# Patient Record
Sex: Female | Born: 1955 | Race: White | Hispanic: No | Marital: Married | Smoking: Never smoker
Health system: Southern US, Community
[De-identification: ages and names within clinical notes are randomized; demographics above are authoritative.]

## PROBLEM LIST (undated history)

## (undated) DIAGNOSIS — S82043A Displaced comminuted fracture of unspecified patella, initial encounter for closed fracture: Secondary | ICD-10-CM

## (undated) DIAGNOSIS — K219 Gastro-esophageal reflux disease without esophagitis: Secondary | ICD-10-CM

## (undated) DIAGNOSIS — Z1211 Encounter for screening for malignant neoplasm of colon: Secondary | ICD-10-CM

## (undated) HISTORY — PX: TUBAL LIGATION: SHX77

## (undated) HISTORY — PX: ABDOMINAL HYSTERECTOMY: SHX81

## (undated) HISTORY — DX: Displaced comminuted fracture of unspecified patella, initial encounter for closed fracture: S82.043A

## (undated) HISTORY — DX: Gastro-esophageal reflux disease without esophagitis: K21.9

## (undated) HISTORY — PX: FRACTURE SURGERY: SHX138

## (undated) HISTORY — PX: BLADDER SURGERY: SHX569

## (undated) HISTORY — DX: Encounter for screening for malignant neoplasm of colon: Z12.11

## (undated) HISTORY — PX: COLONOSCOPY WITH ESOPHAGOGASTRODUODENOSCOPY (EGD): SHX5779

---

## 2006-03-18 ENCOUNTER — Ambulatory Visit: Payer: Self-pay | Admitting: Family Medicine

## 2006-06-10 ENCOUNTER — Ambulatory Visit: Payer: Self-pay | Admitting: Internal Medicine

## 2007-09-14 ENCOUNTER — Ambulatory Visit: Payer: Self-pay | Admitting: Gastroenterology

## 2007-10-23 ENCOUNTER — Ambulatory Visit: Payer: Self-pay | Admitting: Gastroenterology

## 2007-10-27 ENCOUNTER — Ambulatory Visit: Payer: Self-pay | Admitting: Gastroenterology

## 2007-11-19 ENCOUNTER — Ambulatory Visit: Payer: Self-pay | Admitting: Gastroenterology

## 2007-11-20 ENCOUNTER — Ambulatory Visit: Payer: Self-pay | Admitting: Surgery

## 2007-11-27 ENCOUNTER — Ambulatory Visit: Payer: Self-pay | Admitting: Surgery

## 2009-02-16 ENCOUNTER — Ambulatory Visit (HOSPITAL_COMMUNITY): Admission: RE | Admit: 2009-02-16 | Discharge: 2009-02-16 | Payer: Self-pay | Admitting: Family Medicine

## 2011-12-23 ENCOUNTER — Ambulatory Visit: Payer: Self-pay | Admitting: Family Medicine

## 2012-01-01 ENCOUNTER — Ambulatory Visit: Payer: Self-pay | Admitting: Family Medicine

## 2013-03-02 ENCOUNTER — Ambulatory Visit: Payer: Self-pay | Admitting: Family Medicine

## 2013-04-15 HISTORY — PX: KNEE SURGERY: SHX244

## 2013-09-13 DIAGNOSIS — Z1211 Encounter for screening for malignant neoplasm of colon: Secondary | ICD-10-CM

## 2013-09-13 HISTORY — DX: Encounter for screening for malignant neoplasm of colon: Z12.11

## 2013-10-01 ENCOUNTER — Emergency Department: Payer: Self-pay | Admitting: Emergency Medicine

## 2013-10-04 DIAGNOSIS — S82043A Displaced comminuted fracture of unspecified patella, initial encounter for closed fracture: Secondary | ICD-10-CM

## 2013-10-04 HISTORY — DX: Displaced comminuted fracture of unspecified patella, initial encounter for closed fracture: S82.043A

## 2013-10-05 ENCOUNTER — Ambulatory Visit: Payer: Self-pay | Admitting: General Practice

## 2013-10-06 ENCOUNTER — Ambulatory Visit: Payer: Self-pay | Admitting: General Practice

## 2014-03-29 ENCOUNTER — Ambulatory Visit: Payer: Self-pay | Admitting: Family Medicine

## 2014-07-06 ENCOUNTER — Ambulatory Visit: Payer: Self-pay | Admitting: Family Medicine

## 2014-08-06 NOTE — Op Note (Signed)
PATIENT NAME:  Mary Graham, Mary Graham MR#:  [id number] DATE OF BIRTH:  [birth year]  DATE OF PROCEDURE:  10/06/2013  PREOPERATIVE DIAGNOSIS: Comminuted right patellar fracture.   POSTOPERATIVE DIAGNOSIS: Comminuted right patellar fracture.   PROCEDURE PERFORMED: Open reduction internal fixation of right patellar fracture using a tension band wire technique.   SURGEON: Skip Estimable, MD.   ANESTHESIA: General.   ESTIMATED BLOOD LOSS: 25 mL.   FLUIDS REPLACED: 1100 mL of crystalloid.   TOURNIQUET TIME: 107 minutes.   DRAINS: None.   IMPLANTS UTILIZED: 2 - 33mm K-wires, 1.84mm wire  INDICATIONS FOR SURGERY: The patient is a 59 year old female who fell at work and landed on her right knee. X-rays demonstrated a comminuted displaced right patella fracture. After discussion of the risks and benefits of surgical intervention, the patient expressed understanding of the risks and benefits and agreed with plans for surgical intervention.   PROCEDURE IN DETAIL: The patient was brought to the operating room and, after adequate general anesthesia was achieved, a tourniquet was placed on the patient's upper right thigh. The patient's right knee and leg were cleaned and prepped with alcohol and DuraPrep and draped in the usual sterile fashion. A "timeout" was performed as per usual protocol. The right lower extremity was exsanguinated using an Esmarch, and the tourniquet was inflated to 300 mmHg. An anterior longitudinal incision was made. Dissection was carried down to the patella. The fracture site was identified and clot and soft tissue was removed from the fracture site. The 2 inferior portions were still maintained within the soft tissue sleeve of the patella. Provisional reduction was performed and maintained using bone reduction forceps with points. Acceptable alignment was noted. The site was opened so as to place 2 parallel 2.0 mm K wires through the inferior fragment distally. Reduction was again performed  and the 2 K wires were then drilled in a retrograde fashion so as to engage the superior fragment. Good position was noted in both AP and lateral planes using the C-arm. A 1.25 mm wire was then passed behind the K wires superiorly and then crossed over the anterior surface of the patella in a figure-of-eight fashion and then tensioned and twisted. Excellent compression at the fracture site was noted. The articular surface was palpated through the tear in the medial retinaculum with good congruency appreciated. The proximal portion of the wires were then bent and turned and then tapped distally so as to engage the wire. The distal portions of the wires were then bent and cut off so as to minimize soft tissue irritation. Tourniquet was then deflated after total tourniquet time of 107 minutes. Good hemostasis was appreciated. The medial retinaculum was repaired using interrupted sutures of #1 Ethibond. The subcutaneous tissue was approximated in layers using first #0 Vicryl followed by #2-0 Vicryl. Then, 10 mL of 0.25% Marcaine was injected along the incision site in the subcutaneous tissue. Skin was closed with skin staples. Sterile dressing was applied followed by application of Polar Care and Breg range of motion brace locked in extension.   The patient tolerated the procedure well. She was transported to the recovery room in stable condition.    ____________________________ Laurice Record. Holley Bouche., MD jph:sb D: 10/06/2013 20:57:14 ET T: 10/07/2013 10:14:48 ET JOB#: [id number]  cc: Jeneen Rinks P. Holley Bouche., MD, <Dictator> JAMES P Holley Bouche MD ELECTRONICALLY SIGNED 10/16/2013 12:11

## 2015-06-29 ENCOUNTER — Other Ambulatory Visit: Payer: Self-pay | Admitting: Physician Assistant

## 2015-06-29 DIAGNOSIS — Z1231 Encounter for screening mammogram for malignant neoplasm of breast: Secondary | ICD-10-CM

## 2015-07-13 ENCOUNTER — Ambulatory Visit
Admission: RE | Admit: 2015-07-13 | Discharge: 2015-07-13 | Disposition: A | Discharge status: Home / Self Care | Payer: BLUE CROSS/BLUE SHIELD | Source: Ambulatory Visit | Attending: Physician Assistant | Admitting: Physician Assistant

## 2015-07-13 DIAGNOSIS — Z1231 Encounter for screening mammogram for malignant neoplasm of breast: Secondary | ICD-10-CM | POA: Insufficient documentation

## 2016-07-25 ENCOUNTER — Emergency Department: Payer: BLUE CROSS/BLUE SHIELD

## 2016-07-25 ENCOUNTER — Emergency Department
Admission: EM | Admit: 2016-07-25 | Discharge: 2016-07-25 | Disposition: A | Discharge status: Home / Self Care | Payer: BLUE CROSS/BLUE SHIELD | Attending: Emergency Medicine | Admitting: Emergency Medicine

## 2016-07-25 DIAGNOSIS — R42 Dizziness and giddiness: Secondary | ICD-10-CM | POA: Diagnosis not present

## 2016-07-25 DIAGNOSIS — R0789 Other chest pain: Secondary | ICD-10-CM | POA: Insufficient documentation

## 2016-07-25 DIAGNOSIS — R079 Chest pain, unspecified: Secondary | ICD-10-CM

## 2016-07-25 LAB — CBC
HEMATOCRIT: 43.8 % (ref 35.0–47.0)
HEMOGLOBIN: 14.8 g/dL (ref 12.0–16.0)
MCH: 29.6 pg (ref 26.0–34.0)
MCHC: 33.8 g/dL (ref 32.0–36.0)
MCV: 87.5 fL (ref 80.0–100.0)
Platelets: 265 10*3/uL (ref 150–440)
RBC: 5.01 MIL/uL (ref 3.80–5.20)
RDW: 12.9 % (ref 11.5–14.5)
WBC: 10.2 10*3/uL (ref 3.6–11.0)

## 2016-07-25 LAB — TROPONIN I
Troponin I: <0.03 ng/mL (ref <0.03)
Troponin I: <0.03 ng/mL (ref <0.03)

## 2016-07-25 LAB — BASIC METABOLIC PANEL
ANION GAP: 7 (ref 5–15)
BUN: 15 mg/dL (ref 6–20)
CHLORIDE: 108 mmol/L (ref 101–111)
CO2: 24 mmol/L (ref 22–32)
Calcium: 9.5 mg/dL (ref 8.9–10.3)
Creatinine, Ser: 0.68 mg/dL (ref 0.44–1.00)
GFR calc non Af Amer: >60 mL/min (ref >60)
Glucose, Bld: 105 mg/dL — ABNORMAL HIGH (ref 65–99)
Potassium: 3.7 mmol/L (ref 3.5–5.1)
Sodium: 139 mmol/L (ref 135–145)

## 2016-07-25 MED ORDER — ISOSORBIDE MONONITRATE ER 60 MG PO TB24
30.0000 mg | ORAL_TABLET | Freq: Every day | ORAL | Status: DC
  Administered 2016-07-25: 30 mg via ORAL
  Filled 2016-07-25: qty 1

## 2016-07-25 MED ORDER — ASPIRIN 81 MG PO CHEW
324.0000 mg | CHEWABLE_TABLET | Freq: Once | ORAL | Status: AC
  Administered 2016-07-25: 324 mg via ORAL
  Filled 2016-07-25: qty 4

## 2016-07-25 MED ORDER — ISOSORBIDE MONONITRATE ER 30 MG PO TB24: 30.0000 mg | ORAL_TABLET | Freq: Every day | ORAL | 0 refills | Status: DC

## 2016-07-25 MED ORDER — METOPROLOL TARTRATE 25 MG PO TABS
25.0000 mg | ORAL_TABLET | Freq: Once | ORAL | Status: AC
  Administered 2016-07-25: 25 mg via ORAL
  Filled 2016-07-25: qty 1

## 2016-07-25 MED ORDER — ASPIRIN EC 81 MG PO TBEC: 81.0000 mg | DELAYED_RELEASE_TABLET | Freq: Every day | ORAL | 0 refills | Status: AC

## 2016-07-25 MED ORDER — METOPROLOL SUCCINATE ER 25 MG PO TB24: 25.0000 mg | ORAL_TABLET | Freq: Every day | ORAL | 0 refills | Status: DC

## 2016-07-25 NOTE — ED Provider Notes (Addendum)
[facility] Emergency Department Provider Note  ____________________________________________  Time seen: Approximately 1:43 PM  I have reviewed the triage vital signs and the nursing notes.   HISTORY  Chief Complaint Chest Pain    HPI Mary Graham is a 61 y.o. female with a history of atypical chest pain presenting with chest pain. The patient reports that last week she was treated for a urinary tract infection and dehydration by her primary care physician. Today at about 10:30 or 11 AM she was sitting at home after breakfast when she developed a sharp central chest pain that radiated into the left neck, left jaw, and left upper extremity. She did not have any associated shortness of breath, diaphoresis, palpitations, or syncope. She did have some associated lightheadedness, and has been having intermittent lightheadedness since last week. The episode lasted about 50 minutes and self-resolved.  She denies any other recent chest pain episodes. She reports that her last stress test was reassuring and it was several years ago; she also reports a negative cardiac catheterization several years ago when she was having chest pain with walking up hills. I do not have access to those reports.   History reviewed. No pertinent past medical history.  There are no active problems to display for this patient.   Past Surgical History:  Procedure Laterality Date  . ABDOMINAL HYSTERECTOMY    . BLADDER SURGERY        Allergies Penicillins  Family History  Problem Relation Age of Onset  . Breast cancer Paternal Aunt     Social History Social History  Substance Use Topics  . Smoking status: Never Smoker  . Smokeless tobacco: Never Used  . Alcohol use Not on file    Review of Systems Constitutional: No fever/chills.Positive lightheadedness without syncope. Eyes: No visual changes. ENT: No sore throat. No congestion or rhinorrhea. Cardiovascular: Positive  chest pain radiating to the left neck, jaw, and left upper extremity. Denies palpitations. Respiratory: Denies shortness of breath.  No cough. Gastrointestinal: No abdominal pain.  No nausea, no vomiting.  No diarrhea.  No constipation. Genitourinary: Negative for dysuria. Musculoskeletal: Negative for back pain. Skin: Negative for rash. Neurological: Negative for headaches. No focal numbness, tingling or weakness.   10-point ROS otherwise negative.  ____________________________________________   PHYSICAL EXAM:  VITAL SIGNS: ED Triage Vitals  Enc Vitals Group     BP 07/25/16 1130 (!) 159/70     Pulse Rate 07/25/16 1130 100     Resp 07/25/16 1130 18     Temp 07/25/16 1130 98.4 F (36.9 C)     Temp Source 07/25/16 1130 Oral     SpO2 07/25/16 1130 99 %     Weight 07/25/16 1126 170 lb (77.1 kg)     Height 07/25/16 1126 5\' 2"  (1.575 m)     Head Circumference --      Peak Flow --      Pain Score 07/25/16 1125 4     Pain Loc --      Pain Edu? --      Excl. in [place]? --     Constitutional: Alert and oriented. Well appearing and in no acute distress. Answers questions appropriately. Eyes: Conjunctivae are normal.  EOMI. No scleral icterus. Head: Atraumatic. Nose: No congestion/rhinnorhea. Mouth/Throat: Mucous membranes are moist.  Neck: No stridor.  Supple.  No JVD. No meningismus. Cardiovascular: Normal rate, regular rhythm. No murmurs, rubs or gallops.  Respiratory: Normal respiratory effort.  No accessory muscle use or  retractions. Lungs CTAB.  No wheezes, rales or ronchi. Gastrointestinal: Soft, nontender and nondistended.  No guarding or rebound.  No peritoneal signs. Musculoskeletal: No LE edema. No ttp in the calves or palpable cords.  Negative Homan's sign. Neurologic:  A&Ox3.  Speech is clear.  Face and smile are symmetric.  EOMI.  Moves all extremities well. Skin:  Skin is warm, dry and intact. No rash noted. Psychiatric: Mood and affect are normal. Speech and behavior  are normal.  Normal judgement.  ____________________________________________   LABS (all labs ordered are listed, but only abnormal results are displayed)  Labs Reviewed  BASIC METABOLIC PANEL - Abnormal; Notable for the following:       Result Value   Glucose, Bld 105 (*)    All other components within normal limits  CBC  TROPONIN I  TROPONIN I   ____________________________________________  EKG  ED ECG REPORT I, Eula Listen, the attending physician, personally viewed and interpreted this ECG.   Date: 07/25/2016  EKG Time: 1124  Rate: 100  Rhythm: sinus tachycardia  Axis: normal  Intervals:none  ST&T Change: No STEMI  ____________________________________________  RADIOLOGY  No results found.  ____________________________________________   PROCEDURES  Procedure(s) performed: None  Procedures  Critical Care performed: No ____________________________________________   INITIAL IMPRESSION / ASSESSMENT AND PLAN / ED COURSE  Pertinent labs & imaging results that were available during my care of the patient were reviewed by me and considered in my medical decision making (see chart for details).  61 y.o. female presenting with chest pain and lightheadedness. The patient is not a smoker, does not use cocaine, and has no history of hypertension, hyperlipidemia or diabetes; she does not have family history of early heart disease. Other than her age and being overweight, she is low risk for cardiac disease but I'm concerned about the description of her discomfort today. I have reviewed her results and spoken to Dr. Clayborn Bigness, the cardiologist on call. His recommendation is that she can have an outpatient appointment for outpatient stress test either later today or tomorrow, and that she can be discharged with aspirin, Imdur, and metoprolol. I will give her these medications in the emergency department, recheck her troponin and repeat an EKG. If her clinical course  is stable, will plan to discharge her home with this close follow-up plan. Return precautions will be discussed.  ----------------------------------------- 2:44 PM on 07/25/2016 -----------------------------------------  Patient's repeat troponin is negative and she continues to be chest pain-free. Plan discharge at this time. ____________________________________________  FINAL CLINICAL IMPRESSION(S) / ED DIAGNOSES  Final diagnoses:  Chest pain, unspecified type  Lightheadedness         NEW MEDICATIONS STARTED DURING THIS VISIT:  New Prescriptions   ASPIRIN EC 81 MG TABLET    Take 1 tablet (81 mg total) by mouth daily.   ISOSORBIDE MONONITRATE (IMDUR) 30 MG 24 HR TABLET    Take 1 tablet (30 mg total) by mouth daily.   METOPROLOL SUCCINATE (TOPROL XL) 25 MG 24 HR TABLET    Take 1 tablet (25 mg total) by mouth daily.      Eula Listen, MD 07/25/16 1356    Eula Listen, MD 07/25/16 1444

## 2016-07-25 NOTE — Discharge Instructions (Signed)
Please make an appointment to see Dr. Clayborn Bigness in clinic today or tomorrow.  In the meantime, take a daily aspirin, and once daily Imdur and Metoprolol.  Return to the emergency department if you develop severe pain, lightheadedness or fainting, shortness of breath, or any other symptoms concerning to you.

## 2016-07-25 NOTE — ED Triage Notes (Signed)
Pt reports upper mid chest pain. Pt also states left neck pain that radiates down her left arm. Pt denies N/V. Pt reports dizziness. Pt respirations even and unlabored

## 2016-07-26 ENCOUNTER — Other Ambulatory Visit: Payer: Self-pay | Admitting: Internal Medicine

## 2016-07-26 DIAGNOSIS — R109 Unspecified abdominal pain: Secondary | ICD-10-CM

## 2016-07-31 ENCOUNTER — Ambulatory Visit: Payer: BLUE CROSS/BLUE SHIELD

## 2016-08-07 ENCOUNTER — Ambulatory Visit
Admission: RE | Admit: 2016-08-07 | Discharge: 2016-08-07 | Disposition: A | Discharge status: Home / Self Care | Payer: BLUE CROSS/BLUE SHIELD | Source: Ambulatory Visit | Attending: Urology | Admitting: Urology

## 2016-08-07 ENCOUNTER — Encounter: Payer: Self-pay | Admitting: Urology

## 2016-08-07 ENCOUNTER — Ambulatory Visit (INDEPENDENT_AMBULATORY_CARE_PROVIDER_SITE_OTHER): Payer: BLUE CROSS/BLUE SHIELD | Admitting: Urology

## 2016-08-07 VITALS — BP 148/86 | HR 93 | Ht 62.0 in | Wt 166.0 lb

## 2016-08-07 DIAGNOSIS — R109 Unspecified abdominal pain: Secondary | ICD-10-CM

## 2016-08-07 DIAGNOSIS — R3129 Other microscopic hematuria: Secondary | ICD-10-CM

## 2016-08-07 DIAGNOSIS — R1907 Generalized intra-abdominal and pelvic swelling, mass and lump: Secondary | ICD-10-CM | POA: Diagnosis not present

## 2016-08-07 DIAGNOSIS — M47816 Spondylosis without myelopathy or radiculopathy, lumbar region: Secondary | ICD-10-CM | POA: Diagnosis not present

## 2016-08-07 DIAGNOSIS — R300 Dysuria: Secondary | ICD-10-CM

## 2016-08-07 DIAGNOSIS — Z9049 Acquired absence of other specified parts of digestive tract: Secondary | ICD-10-CM | POA: Diagnosis not present

## 2016-08-07 DIAGNOSIS — M4186 Other forms of scoliosis, lumbar region: Secondary | ICD-10-CM | POA: Diagnosis not present

## 2016-08-07 LAB — URINALYSIS, COMPLETE
Bilirubin, UA: NEGATIVE
GLUCOSE, UA: NEGATIVE
Ketones, UA: NEGATIVE
Leukocytes, UA: NEGATIVE
NITRITE UA: NEGATIVE
PH UA: 5 (ref 5.0–7.5)
Protein, UA: NEGATIVE
Specific Gravity, UA: <1.005 — ABNORMAL LOW (ref 1.005–1.030)
UUROB: 0.2 mg/dL (ref 0.2–1.0)

## 2016-08-07 LAB — MICROSCOPIC EXAMINATION
Bacteria, UA: NONE SEEN
RBC MICROSCOPIC, UA: NONE SEEN /HPF (ref 0–?)
WBC, UA: NONE SEEN /hpf (ref 0–?)

## 2016-08-07 NOTE — Progress Notes (Signed)
08/07/2016 3:11 PM   Mary Graham July 23, 1955 [id number]  Referring provider: Elisabeth Cara, NP [address] [place], [place] [address]  Chief Complaint  Patient presents with  . Hematuria    New Patient    HPI: 61 year old female referred for further evaluation of possible microscopic hematuria and diffuse abdminal and flank pain.  She reports that over the past 1-2 months, she's had diffuse abdominal pain bilaterally as well as nonspecific flank pain. This tends to come and go. She is also had associated nausea after eating and diarrhea after eating. She is being worked up by GI for this in the near future.  UA on 07/17/16 at her primary care's clinic 11-30 white blood cells per high-powered field, 0-2 red blood test per high-power field and presence of calcium oxalate crystals. Urine culture ultimately grew 10-25,000 colonies of mixed flora.  She was treated with macrobid as a precaution and her dysuria resolved But has since recurred..     Repeat urinalysis today shows trace blood on dip but no red blood cells, UA otherwise negative without evidence of microscopic hematuria.   Most recent CT abdomen and pelvis without contrast on 07/06/2014 shows no evidence of stones bilaterally.  She denies a history of kidney stones but is worried that her pain may be coming from a kidney stone.  She is supposed to be using vaginal estrogen cream twice per week but has not been using the medication regularly.     PMH: Past Medical History:  Diagnosis Date  . Displaced comminuted fracture of patella 10/04/2013  . GERD (gastroesophageal reflux disease)   . Screen for colon cancer 09/13/2013   Overview:  Patient has a family history of FAP.  She has been genetically tested and is negative.    Surgical History: Past Surgical History:  Procedure Laterality Date  . ABDOMINAL HYSTERECTOMY    . BLADDER SURGERY    . KNEE SURGERY  2015    Home Medications:  Allergies as of 08/07/2016        Reactions   Penicillins       Medication List       Accurate as of 08/07/16  3:11 PM. Always use your most recent med list.          aspirin EC 81 MG tablet Take 1 tablet (81 mg total) by mouth daily.   Cholecalciferol 1000 units tablet Take 1,000 Units by mouth every 30 (thirty) days.   isosorbide mononitrate 30 MG 24 hr tablet Commonly known as:  IMDUR Take 1 tablet (30 mg total) by mouth daily.   metoprolol succinate 25 MG 24 hr tablet Commonly known as:  TOPROL XL Take 1 tablet (25 mg total) by mouth daily.       Allergies:  Allergies  Allergen Reactions  . Penicillins     Family History: Family History  Problem Relation Age of Onset  . Breast cancer Paternal Aunt   . Prostate cancer Neg Hx   . Bladder Cancer Neg Hx   . Kidney cancer Neg Hx     Social History:  reports that she has never smoked. She has never used smokeless tobacco. She reports that she does not drink alcohol or use drugs.  ROS: UROLOGY Frequent Urination?: Yes Hard to postpone urination?: No Burning/pain with urination?: Yes Get up at night to urinate?: Yes Leakage of urine?: Yes Urine stream starts and stops?: No Trouble starting stream?: No Do you have to strain to urinate?: No  Blood in urine?: Yes Urinary tract infection?: No Sexually transmitted disease?: No Injury to kidneys or bladder?: No Painful intercourse?: Yes Weak stream?: No Currently pregnant?: No Vaginal bleeding?: No Last menstrual period?: Hysterectomy  Gastrointestinal Nausea?: Yes Vomiting?: No Indigestion/heartburn?: Yes Diarrhea?: Yes Constipation?: Yes  Constitutional Fever: No Night sweats?: No Weight loss?: No Fatigue?: No  Skin Skin rash/lesions?: No Itching?: No  Eyes Blurred vision?: Yes Double vision?: No  Ears/Nose/Throat Sore throat?: No Sinus problems?: No  Hematologic/Lymphatic Swollen glands?: No Easy bruising?: No  Cardiovascular Leg swelling?: No Chest pain?:  Yes  Respiratory Cough?: No Shortness of breath?: No  Endocrine Excessive thirst?: No  Musculoskeletal Back pain?: Yes Joint pain?: No  Neurological Headaches?: Yes Dizziness?: Yes  Psychologic Depression?: No Anxiety?: No  Physical Exam: BP (!) 148/86   Pulse 93   Ht 5\' 2"  (1.575 m)   Wt 166 lb (75.3 kg)   BMI 30.36 kg/m   Constitutional:  Alert and oriented, No acute distress. HEENT: [place] AT, moist mucus membranes.  Trachea midline, no masses. Cardiovascular: No clubbing, cyanosis, or edema. Respiratory: Normal respiratory effort, no increased work of breathing. GI: Abdomen is soft, nondistended, no abdominal masses.  She does endorse abdominal pain bilaterally in all 4 quadrants with palpation. GU: Mild bilateral CVA tenderness. Skin: No rashes, bruises or suspicious lesions. Neurologic: Grossly intact, no focal deficits, moving all 4 extremities. Psychiatric: Normal mood and affect.  Laboratory Data: Lab Results  Component Value Date   WBC 10.2 07/25/2016   HGB 14.8 07/25/2016   HCT 43.8 07/25/2016   MCV 87.5 07/25/2016   PLT 265 07/25/2016    Lab Results  Component Value Date   CREATININE 0.68 07/25/2016    Urinalysis UA as above  Pertinent Imaging: KUB ordered for today  Assessment & Plan:    1. Microscopic hematuria Referred for possible microscopic hematuria although this is not evident on UA from PCP or today. As such, no further meniscal hematuria workup is indicated at this time. - Urinalysis, Complete  2. Dysuria No evidence of infection today. History of atrophic vaginitis and has not been using her vaginal estrogen cream.  I advised her to resume this medication and contact us if her dysuria does not resolve within 1 month of use.  3. Flank pain Location and nature of her pain is not consistent with kidney stones although she does have some calcium oxalate crystals on urinalysis at the PCP office Previous cross sectional imaging in  2016 without evidence of stones, no previous stone episodes Recommend KUB today with follow-up renal shunt to rule out any obstructing urolithiasis - DG Abd 1 View; Future - US Renal; Future  4. Generalized abdominal mass Not likely GU in origin, GI workup pending   Return for KUB today, f/u RUS (will call with results), f/u prn.  Hollice Espy, MD  [facility] [address], [place], [place] [address] [phone]

## 2016-08-08 ENCOUNTER — Telehealth: Payer: Self-pay

## 2016-08-08 NOTE — Telephone Encounter (Signed)
Spoke with patient all questions answered. ° °Mary Graham °

## 2016-08-08 NOTE — Telephone Encounter (Signed)
LMOM

## 2016-08-08 NOTE — Telephone Encounter (Signed)
-----   Message from Hollice Espy, MD sent at 08/07/2016  5:14 PM EDT ----- No evidence of stone. As well as her ultrasound looks fine, please follow-up with [place] as needed.

## 2016-08-14 ENCOUNTER — Other Ambulatory Visit: Payer: Self-pay | Admitting: Nurse Practitioner

## 2016-08-15 ENCOUNTER — Ambulatory Visit
Admission: RE | Admit: 2016-08-15 | Discharge: 2016-08-15 | Disposition: A | Discharge status: Home / Self Care | Payer: BLUE CROSS/BLUE SHIELD | Source: Ambulatory Visit | Attending: Urology | Admitting: Urology

## 2016-08-15 DIAGNOSIS — R109 Unspecified abdominal pain: Secondary | ICD-10-CM | POA: Diagnosis not present

## 2017-01-17 ENCOUNTER — Other Ambulatory Visit: Payer: Self-pay | Admitting: Internal Medicine

## 2017-01-17 DIAGNOSIS — Z1231 Encounter for screening mammogram for malignant neoplasm of breast: Secondary | ICD-10-CM

## 2017-01-29 ENCOUNTER — Encounter: Payer: Self-pay | Admitting: *Deleted

## 2017-01-30 ENCOUNTER — Ambulatory Visit: Payer: BLUE CROSS/BLUE SHIELD | Admitting: Anesthesiology

## 2017-01-30 ENCOUNTER — Ambulatory Visit
Admission: RE | Admit: 2017-01-30 | Discharge: 2017-01-30 | Disposition: A | Discharge status: Home / Self Care | Payer: BLUE CROSS/BLUE SHIELD | Source: Ambulatory Visit | Attending: Gastroenterology | Admitting: Gastroenterology

## 2017-01-30 ENCOUNTER — Encounter
Admission: RE | Disposition: A | Discharge status: Home / Self Care | Payer: Self-pay | Source: Ambulatory Visit | Attending: Gastroenterology

## 2017-01-30 DIAGNOSIS — R1012 Left upper quadrant pain: Secondary | ICD-10-CM | POA: Insufficient documentation

## 2017-01-30 DIAGNOSIS — Z79899 Other long term (current) drug therapy: Secondary | ICD-10-CM | POA: Insufficient documentation

## 2017-01-30 DIAGNOSIS — Z9049 Acquired absence of other specified parts of digestive tract: Secondary | ICD-10-CM | POA: Insufficient documentation

## 2017-01-30 DIAGNOSIS — R1030 Lower abdominal pain, unspecified: Secondary | ICD-10-CM | POA: Insufficient documentation

## 2017-01-30 DIAGNOSIS — R1013 Epigastric pain: Secondary | ICD-10-CM | POA: Diagnosis present

## 2017-01-30 DIAGNOSIS — D123 Benign neoplasm of transverse colon: Secondary | ICD-10-CM | POA: Diagnosis not present

## 2017-01-30 DIAGNOSIS — K317 Polyp of stomach and duodenum: Secondary | ICD-10-CM | POA: Insufficient documentation

## 2017-01-30 DIAGNOSIS — Z7982 Long term (current) use of aspirin: Secondary | ICD-10-CM | POA: Diagnosis not present

## 2017-01-30 DIAGNOSIS — K314 Gastric diverticulum: Secondary | ICD-10-CM | POA: Insufficient documentation

## 2017-01-30 DIAGNOSIS — R1011 Right upper quadrant pain: Secondary | ICD-10-CM | POA: Insufficient documentation

## 2017-01-30 DIAGNOSIS — K641 Second degree hemorrhoids: Secondary | ICD-10-CM | POA: Diagnosis not present

## 2017-01-30 DIAGNOSIS — D125 Benign neoplasm of sigmoid colon: Secondary | ICD-10-CM | POA: Diagnosis not present

## 2017-01-30 DIAGNOSIS — K319 Disease of stomach and duodenum, unspecified: Secondary | ICD-10-CM | POA: Diagnosis not present

## 2017-01-30 DIAGNOSIS — K210 Gastro-esophageal reflux disease with esophagitis: Secondary | ICD-10-CM | POA: Diagnosis not present

## 2017-01-30 HISTORY — PX: ESOPHAGOGASTRODUODENOSCOPY (EGD) WITH PROPOFOL: SHX5813

## 2017-01-30 HISTORY — PX: COLONOSCOPY WITH PROPOFOL: SHX5780

## 2017-01-30 SURGERY — ESOPHAGOGASTRODUODENOSCOPY (EGD) WITH PROPOFOL
Anesthesia: General

## 2017-01-30 MED ORDER — PROPOFOL 500 MG/50ML IV EMUL
INTRAVENOUS | Status: AC
  Filled 2017-01-30: qty 50

## 2017-01-30 MED ORDER — SODIUM CHLORIDE 0.9 % IV SOLN: INTRAVENOUS | Status: DC

## 2017-01-30 MED ORDER — PHENYLEPHRINE HCL 10 MG/ML IJ SOLN
INTRAMUSCULAR | Status: DC | PRN
  Administered 2017-01-30: 50 ug via INTRAVENOUS

## 2017-01-30 MED ORDER — EPHEDRINE SULFATE 50 MG/ML IJ SOLN
INTRAMUSCULAR | Status: AC
  Filled 2017-01-30: qty 1

## 2017-01-30 MED ORDER — MIDAZOLAM HCL 2 MG/2ML IJ SOLN
INTRAMUSCULAR | Status: AC
  Filled 2017-01-30: qty 2

## 2017-01-30 MED ORDER — SODIUM CHLORIDE 0.9 % IV SOLN
INTRAVENOUS | Status: DC
  Administered 2017-01-30: 11:00:00 via INTRAVENOUS

## 2017-01-30 MED ORDER — FENTANYL CITRATE (PF) 100 MCG/2ML IJ SOLN
INTRAMUSCULAR | Status: AC
  Filled 2017-01-30: qty 2

## 2017-01-30 MED ORDER — BUTAMBEN-TETRACAINE-BENZOCAINE 2-2-14 % EX AERO
INHALATION_SPRAY | CUTANEOUS | Status: DC | PRN
  Administered 2017-01-30: 2 via TOPICAL

## 2017-01-30 MED ORDER — FENTANYL CITRATE (PF) 100 MCG/2ML IJ SOLN
INTRAMUSCULAR | Status: DC | PRN
  Administered 2017-01-30 (×2): 25 ug via INTRAVENOUS
  Administered 2017-01-30: 50 ug via INTRAVENOUS

## 2017-01-30 MED ORDER — LIDOCAINE HCL (PF) 2 % IJ SOLN
INTRAMUSCULAR | Status: AC
  Filled 2017-01-30: qty 10

## 2017-01-30 MED ORDER — MIDAZOLAM HCL 2 MG/2ML IJ SOLN
INTRAMUSCULAR | Status: DC | PRN
  Administered 2017-01-30: 2 mg via INTRAVENOUS

## 2017-01-30 MED ORDER — EPHEDRINE SULFATE 50 MG/ML IJ SOLN
INTRAMUSCULAR | Status: DC | PRN
  Administered 2017-01-30: 10 mg via INTRAVENOUS
  Administered 2017-01-30: 5 mg via INTRAVENOUS
  Administered 2017-01-30: 10 mg via INTRAVENOUS

## 2017-01-30 MED ORDER — PROPOFOL 500 MG/50ML IV EMUL
INTRAVENOUS | Status: DC | PRN
  Administered 2017-01-30: 120 ug/kg/min via INTRAVENOUS

## 2017-01-30 MED ORDER — LIDOCAINE HCL (CARDIAC) 20 MG/ML IV SOLN
INTRAVENOUS | Status: DC | PRN
  Administered 2017-01-30: 30 mg via INTRAVENOUS

## 2017-01-30 MED ORDER — PROPOFOL 10 MG/ML IV BOLUS
INTRAVENOUS | Status: AC
  Filled 2017-01-30: qty 20

## 2017-01-30 MED ORDER — PHENYLEPHRINE HCL 10 MG/ML IJ SOLN
INTRAMUSCULAR | Status: AC
  Filled 2017-01-30: qty 1

## 2017-01-30 NOTE — Anesthesia Procedure Notes (Signed)
Performed by: COOK-MARTIN, Rontrell Moquin Pre-anesthesia Checklist: Patient identified, Emergency Drugs available, Suction available, Patient being monitored and Timeout performed Patient Re-evaluated:Patient Re-evaluated prior to induction Oxygen Delivery Method: Nasal cannula Preoxygenation: Pre-oxygenation with 100% oxygen Induction Type: IV induction Airway Equipment and Method: Bite block Placement Confirmation: CO2 detector and positive ETCO2       

## 2017-01-30 NOTE — Transfer of Care (Signed)
Immediate Anesthesia Transfer of Care Note  Patient: Mary Graham  Procedure(s) Performed: ESOPHAGOGASTRODUODENOSCOPY (EGD) WITH PROPOFOL (N/A ) COLONOSCOPY WITH PROPOFOL (N/A )  Patient Location: PACU  Anesthesia Type:General  Level of Consciousness: awake and sedated  Airway & Oxygen Therapy: Patient Spontanous Breathing and Patient connected to nasal cannula oxygen  Post-op Assessment: Report given to RN and Post -op Vital signs reviewed and stable  Post vital signs: Reviewed and stable  Last Vitals:  Vitals:   01/30/17 1038  BP: 130/68  Pulse: 100  Resp: 18  Temp: 37.1 C  SpO2: 100%    Last Pain:  Vitals:   01/30/17 1038  TempSrc: Tympanic  PainSc: 2          Complications: No apparent anesthesia complications

## 2017-01-30 NOTE — H&P (Signed)
Outpatient short stay form Pre-procedure 01/30/2017 11:22 AM Lollie Sails MD   Primary Physician: Brunswick Spencer NP  Reason for visit:  egd and colonoscopy  History of present illness:   patinet is a 61 yo female presenting with c/o abdominal pain in the ruq and luq but also in the lower abdomen.  There is accompanying dyspepsia, and a personal h/o adenomatous colon polyps. She has been prescribed a proton pump inhibitor currently not taking due to cost issues. She has had a cholecystectomy in the past.  She does take 81 mg aspirin daily. She takes no other aspirin products or blood thinning agents.  Current Facility-Administered Medications:  .  0.9 %  sodium chloride infusion, , Intravenous, Continuous, Lollie Sails, MD, Last Rate: 20 mL/hr at 01/30/17 1051 .  0.9 %  sodium chloride infusion, , Intravenous, Continuous, Lollie Sails, MD  Prescriptions Prior to Admission  Medication Sig Dispense Refill Last Dose  . Cholecalciferol 1000 units tablet Take 1,000 Units by mouth every 30 (thirty) days.   Past Month at Unknown time  . ranitidine (ZANTAC) 150 MG tablet Take 150 mg by mouth 2 (two) times daily.   Past Week at Unknown time  . aspirin EC 81 MG tablet Take 1 tablet (81 mg total) by mouth daily. (Patient not taking: Reported on 08/07/2016) 30 tablet 0 Not Taking  . isosorbide mononitrate (IMDUR) 30 MG 24 hr tablet Take 1 tablet (30 mg total) by mouth daily. (Patient not taking: Reported on 08/07/2016) 30 tablet 0 Not Taking  . metoprolol succinate (TOPROL XL) 25 MG 24 hr tablet Take 1 tablet (25 mg total) by mouth daily. (Patient not taking: Reported on 08/07/2016) 30 tablet 0 Not Taking  . pantoprazole (PROTONIX) 40 MG tablet Take 40 mg by mouth daily.   Not Taking at Unknown time     Allergies  Allergen Reactions  . Penicillins      Past Medical History:  Diagnosis Date  . Displaced comminuted fracture of patella 10/04/2013  . GERD (gastroesophageal  reflux disease)   . Screen for colon cancer 09/13/2013   Overview:  Patient has a family history of FAP.  She has been genetically tested and is negative.    Review of systems:      Physical Exam    Heart and lungs: Regular rate and rhythm without rub or gallop, lungs are bilaterally clear.    HEENT: Normocephalic atraumatic eyes are anicteric    Other:     Pertinant exam for procedure: Soft nontender nondistended bowel sounds positive normoactive.    Planned proceedures: EGD, colonoscopy and indicated procedures. I have discussed the risks benefits and complications of procedures to include not limited to bleeding, infection, perforation and the risk of sedation and the patient wishes to proceed.    Lollie Sails, MD Gastroenterology 01/30/2017  11:22 AM

## 2017-01-30 NOTE — Op Note (Addendum)
[facility] Gastroenterology Patient Name: Mary Graham Procedure Date: 01/30/2017 11:23 AM MRN: [id number] Account #: [id number] Date of Birth: 08-09-1955 Admit Type: Outpatient Age: 61 Room: [facility] ENDO ROOM 1 Gender: Female Note Status: Finalized Procedure:            Upper GI endoscopy Indications:          Dyspepsia, Suspected esophageal reflux Providers:            Lollie Sails, MD Referring MD:         No Local Md, MD (Referring MD) Medicines:            Monitored Anesthesia Care Complications:        No immediate complications. Procedure:            Pre-Anesthesia Assessment:                       - ASA Grade Assessment: III - A patient with severe                        systemic disease.                       After obtaining informed consent, the endoscope was                        passed under direct vision. Throughout the procedure,                        the patient's blood pressure, pulse, and oxygen                        saturations were monitored continuously. The Endoscope                        was introduced through the mouth, and advanced to the                        third part of duodenum. The upper GI endoscopy was                        accomplished without difficulty. The patient tolerated                        the procedure well. The patient tolerated the procedure                        well. Findings:      LA Grade A (one or more mucosal breaks less than 5 mm, not extending       between tops of 2 mucosal folds) esophagitis with no bleeding was found.       Biopsies were taken with a cold forceps for histology.      The exam of the esophagus was otherwise normal.      Patchy minimal inflammation characterized by adherent blood and erythema       was found in the gastric body. Biopsies were taken with a cold forceps       for histology. Biopsies were taken with a cold forceps for Helicobacter       pylori testing.      The  cardia and gastric fundus were normal on  retroflexion otherwise.      Diffuse mild mucosal variance characterized by smoothness was found in       the entire duodenum. Biopsies were taken with a cold forceps for       histology.      A few 1 to 3 mm sessile polyps with no bleeding and no stigmata of       recent bleeding were found in the gastric body. Biopsies were taken with       a cold forceps for histology.      A small non-bleeding diverticulum was found in the gastric fundus. Impression:           - LA Grade A reflux esophagitis. Biopsied.                       - Erosive gastritis. Biopsied.                       - Mucosal variant in the duodenum. Biopsied. Recommendation:       - Return to GI clinic in 5 weeks.                       - Await pathology results.                       - Use Prilosec (omeprazole) 20 mg PO daily. Procedure Code(s):    --- Professional ---                       [phone], Esophagogastroduodenoscopy, flexible, transoral;                        with biopsy, single or multiple Diagnosis Code(s):    --- Professional ---                       K21.0, Gastro-esophageal reflux disease with esophagitis                       K29.60, Other gastritis without bleeding                       K31.89, Other diseases of stomach and duodenum                       R10.13, Epigastric pain CPT copyright 2016 American Medical Association. All rights reserved. The codes documented in this report are preliminary and upon coder review may  be revised to meet current compliance requirements. Lollie Sails, MD 01/30/2017 11:55:21 AM This report has been signed electronically. Number of Addenda: 0 Note Initiated On: 01/30/2017 11:23 AM      [facility]

## 2017-01-30 NOTE — Op Note (Signed)
[facility] Gastroenterology Patient Name: Mary Graham Procedure Date: 01/30/2017 11:22 AM MRN: [id number] Account #: [id number] Date of Birth: 1955-12-17 Admit Type: Outpatient Age: 61 Room: [facility] ENDO ROOM 1 Gender: Female Note Status: Finalized Procedure:            Colonoscopy Indications:          Abdominal pain in the left upper quadrant, Abdominal                        pain in the right upper quadrant, Lower abdominal pain Providers:            Lollie Sails, MD Referring MD:         No Local Md, MD (Referring MD) Complications:        No immediate complications. Procedure:            Pre-Anesthesia Assessment:                       - ASA Grade Assessment: III - A patient with severe                        systemic disease.                       After obtaining informed consent, the colonoscope was                        passed under direct vision. Throughout the procedure,                        the patient's blood pressure, pulse, and oxygen                        saturations were monitored continuously. The                        Colonoscope was introduced through the anus and                        advanced to the the cecum, identified by appendiceal                        orifice and ileocecal valve. The colonoscopy was                        performed without difficulty. The patient tolerated the                        procedure well. The quality of the bowel preparation                        was good. Findings:      A 4 mm polyp was found in the proximal sigmoid colon. The polyp was       sessile. The polyp was removed with a cold biopsy forceps. Resection and       retrieval were complete.      A 5 mm polyp was found in the proximal transverse colon. The polyp was       sessile. The polyp was removed with a cold biopsy forceps. The polyp was  removed with a cold snare. Resection and retrieval were complete.       Biopsies for  histology were taken with a cold forceps from the right       colon and left colon for evaluation of microscopic colitis.      Non-bleeding external and internal hemorrhoids were found during       perianal exam, during digital exam and during anoscopy. The hemorrhoids       were small and Grade II (internal hemorrhoids that prolapse but reduce       spontaneously).      The retroflexed view of the distal rectum and anal verge was normal and       showed no anal or rectal abnormalities otherwise. Impression:           - One 4 mm polyp in the proximal sigmoid colon, removed                        with a cold biopsy forceps. Resected and retrieved.                       - One 5 mm polyp in the proximal transverse colon,                        removed with a cold snare and removed with a cold                        biopsy forceps. Resected and retrieved. Biopsied.                       - Non-bleeding external and internal hemorrhoids.                       - The distal rectum and anal verge are normal on                        retroflexion view. Recommendation:       - Discharge patient to home.                       - Await pathology results.                       - Use Analpram HC Cream 2.5%: Apply externally TID for                        10 days.                       - Await pathology results.                       - Return to GI clinic in 5 weeks. Procedure Code(s):    --- Professional ---                       [phone], Colonoscopy, flexible; with removal of tumor(s),                        polyp(s), or other lesion(s) by snare technique  [id number], 36, Colonoscopy, flexible; with biopsy, single                        or multiple Diagnosis Code(s):    --- Professional ---                       D12.5, Benign neoplasm of sigmoid colon                       D12.3, Benign neoplasm of transverse colon (hepatic                        flexure or splenic flexure)                        K64.1, Second degree hemorrhoids                       R10.12, Left upper quadrant pain                       R10.11, Right upper quadrant pain                       R10.30, Lower abdominal pain, unspecified CPT copyright 2016 American Medical Association. All rights reserved. The codes documented in this report are preliminary and upon coder review may  be revised to meet current compliance requirements. Lollie Sails, MD 01/30/2017 12:32:25 PM This report has been signed electronically. Number of Addenda: 0 Note Initiated On: 01/30/2017 11:22 AM Scope Withdrawal Time: 0 hours 10 minutes 8 seconds  Total Procedure Duration: 0 hours 29 minutes 23 seconds       [facility]

## 2017-01-30 NOTE — Anesthesia Post-op Follow-up Note (Signed)
Anesthesia QCDR form completed.        

## 2017-01-30 NOTE — Anesthesia Postprocedure Evaluation (Signed)
Anesthesia Post Note  Patient: Mary Graham  Procedure(s) Performed: ESOPHAGOGASTRODUODENOSCOPY (EGD) WITH PROPOFOL (N/A ) COLONOSCOPY WITH PROPOFOL (N/A )  Patient location during evaluation: Endoscopy Anesthesia Type: General Level of consciousness: awake and alert Pain management: pain level controlled Vital Signs Assessment: post-procedure vital signs reviewed and stable Respiratory status: spontaneous breathing, nonlabored ventilation, respiratory function stable and patient connected to nasal cannula oxygen Cardiovascular status: blood pressure returned to baseline and stable Postop Assessment: no apparent nausea or vomiting Anesthetic complications: no     Last Vitals:  Vitals:   01/30/17 1038 01/30/17 1234  BP: 130/68 (!) 98/54  Pulse: 100 89  Resp: 18 13  Temp: 37.1 C (!) 36.2 C  SpO2: 100% 100%    Last Pain:  Vitals:   01/30/17 1234  TempSrc: Tympanic  PainSc: Asleep                 [name]

## 2017-01-30 NOTE — Anesthesia Preprocedure Evaluation (Signed)
Anesthesia Evaluation  Patient identified by MRN, date of birth, ID band Patient awake    Reviewed: Allergy & Precautions, H&P , NPO status , Patient's Chart, lab work & pertinent test results  History of Anesthesia Complications Negative for: history of anesthetic complications  Airway Mallampati: III  TM Distance: <3 FB Neck ROM: limited    Dental  (+) Poor Dentition, Chipped   Pulmonary neg pulmonary ROS, neg shortness of breath,           Cardiovascular Exercise Tolerance: Good (-) angina(-) Past MI and (-) DOE negative cardio ROS       Neuro/Psych negative neurological ROS  negative psych ROS   GI/Hepatic Neg liver ROS, GERD  Medicated and Controlled,  Endo/Other  negative endocrine ROS  Renal/GU negative Renal ROS  negative genitourinary   Musculoskeletal   Abdominal   Peds  Hematology negative hematology ROS (+)   Anesthesia Other Findings Past Medical History: 10/04/2013: Displaced comminuted fracture of patella No date: GERD (gastroesophageal reflux disease) 09/13/2013: Screen for colon cancer     Comment:  Overview:  Patient has a family history of FAP.  She has              been genetically tested and is negative.  Past Surgical History: No date: ABDOMINAL HYSTERECTOMY No date: BLADDER SURGERY No date: COLONOSCOPY WITH ESOPHAGOGASTRODUODENOSCOPY (EGD) No date: FRACTURE SURGERY 2015: KNEE SURGERY No date: TUBAL LIGATION  BMI    Body Mass Index:  30.61 kg/m      Reproductive/Obstetrics negative OB ROS                             Anesthesia Physical Anesthesia Plan  ASA: III  Anesthesia Plan: General   Post-op Pain Management:    Induction: Intravenous  PONV Risk Score and Plan: Propofol infusion  Airway Management Planned: Natural Airway and Nasal Cannula  Additional Equipment:   Intra-op Plan:   Post-operative Plan:   Informed Consent: I have  reviewed the patients History and Physical, chart, labs and discussed the procedure including the risks, benefits and alternatives for the proposed anesthesia with the patient or authorized representative who has indicated his/her understanding and acceptance.   Dental Advisory Given  Plan Discussed with: Anesthesiologist, CRNA and Surgeon  Anesthesia Plan Comments: (Patient consented for risks of anesthesia including but not limited to:  - adverse reactions to medications - risk of intubation if required - damage to teeth, lips or other oral mucosa - sore throat or hoarseness - Damage to heart, brain, lungs or loss of life  Patient voiced understanding.)        Anesthesia Quick Evaluation

## 2017-02-03 LAB — SURGICAL PATHOLOGY

## 2017-02-04 ENCOUNTER — Ambulatory Visit
Admission: RE | Admit: 2017-02-04 | Discharge: 2017-02-04 | Disposition: A | Discharge status: Home / Self Care | Payer: BLUE CROSS/BLUE SHIELD | Source: Ambulatory Visit | Attending: Internal Medicine | Admitting: Internal Medicine

## 2017-02-04 ENCOUNTER — Encounter: Payer: Self-pay | Admitting: Gastroenterology

## 2017-02-04 DIAGNOSIS — Z1231 Encounter for screening mammogram for malignant neoplasm of breast: Secondary | ICD-10-CM

## 2017-12-29 ENCOUNTER — Other Ambulatory Visit: Payer: Self-pay | Admitting: Nurse Practitioner

## 2017-12-29 DIAGNOSIS — Z1231 Encounter for screening mammogram for malignant neoplasm of breast: Secondary | ICD-10-CM

## 2018-02-05 ENCOUNTER — Ambulatory Visit
Admission: RE | Admit: 2018-02-05 | Discharge: 2018-02-05 | Disposition: A | Discharge status: Home / Self Care | Payer: BLUE CROSS/BLUE SHIELD | Source: Ambulatory Visit | Attending: Nurse Practitioner | Admitting: Nurse Practitioner

## 2018-02-05 DIAGNOSIS — Z1231 Encounter for screening mammogram for malignant neoplasm of breast: Secondary | ICD-10-CM

## 2018-11-04 IMAGING — CR DG CHEST 2V
2 series · 2 of 2 positions shown · non-contrast
Comparison: None in PACs

CLINICAL DATA: Mid upper chest pain intermittently for the past
several weeks. The patient reports an episode this morning was worse
than before. Pain radiates to the left neck and left arm. The
patient porch dizziness but no nausea vomiting

EXAM:
CHEST  2 VIEW

[chest pa]
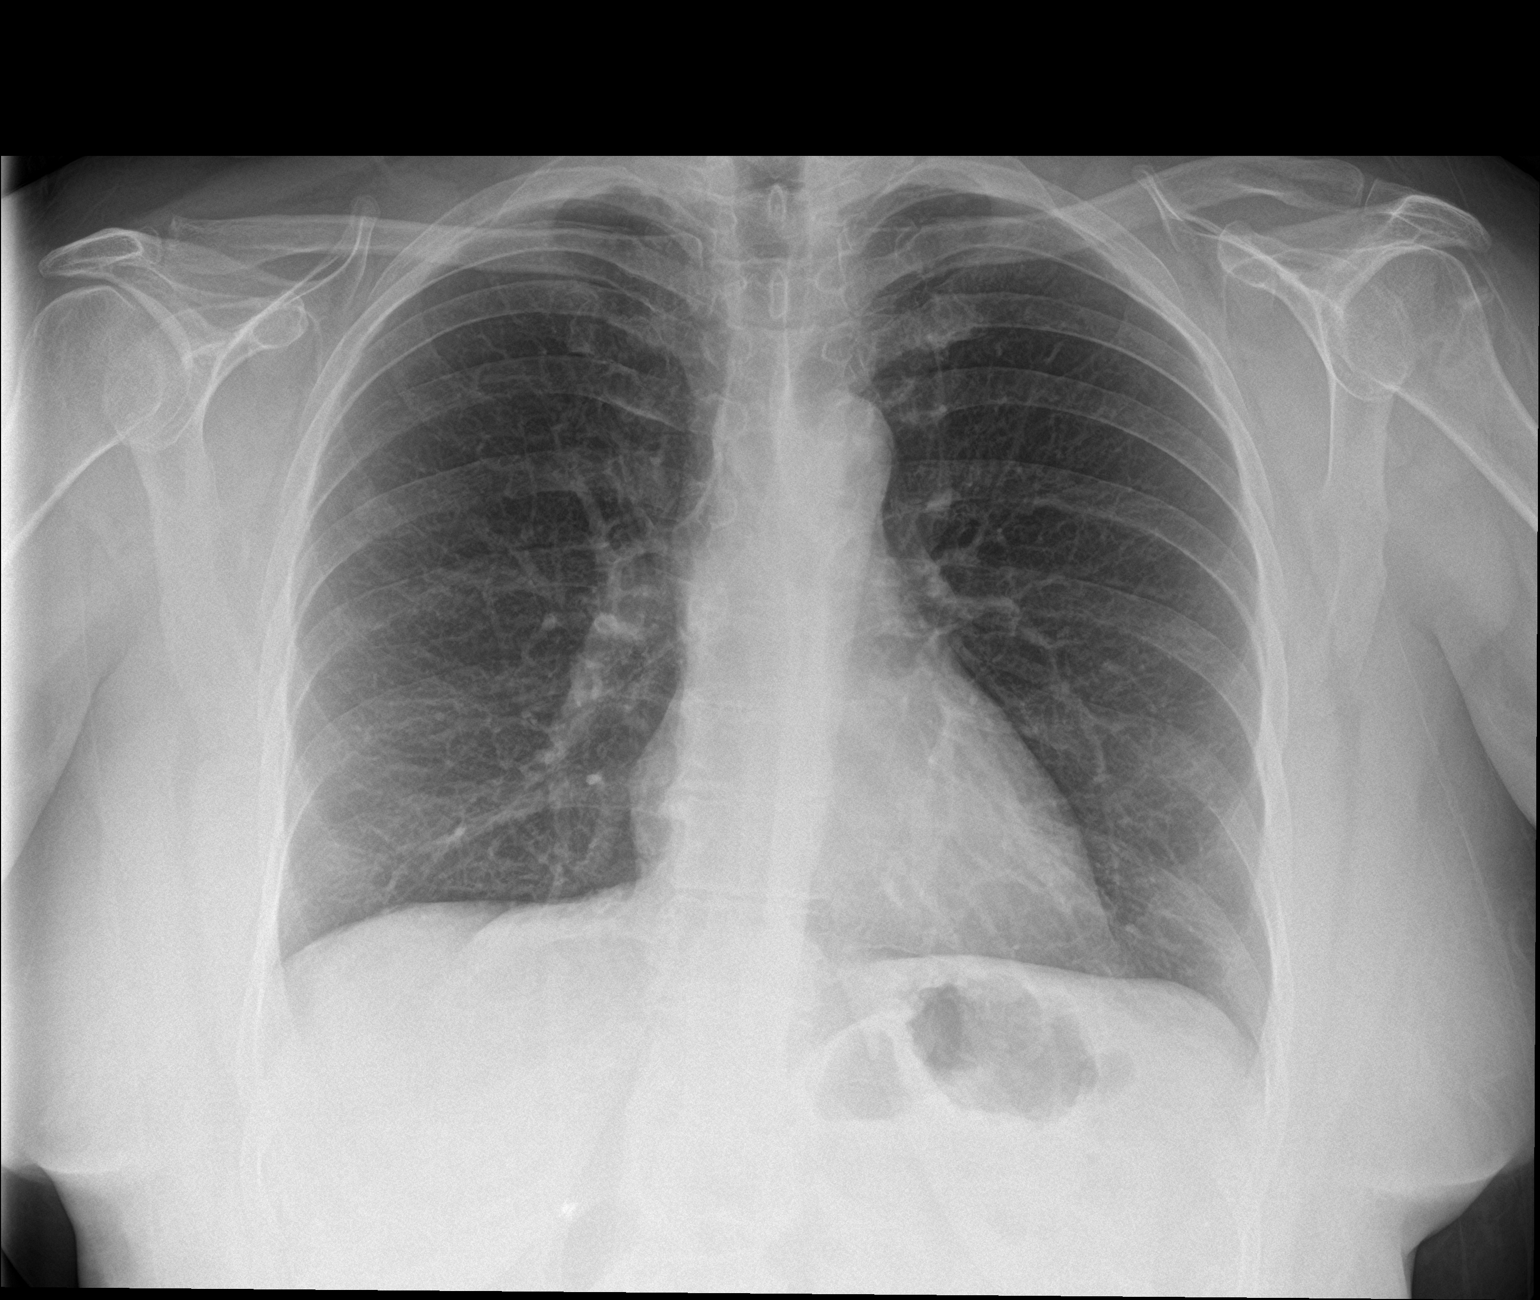

[chest lat]
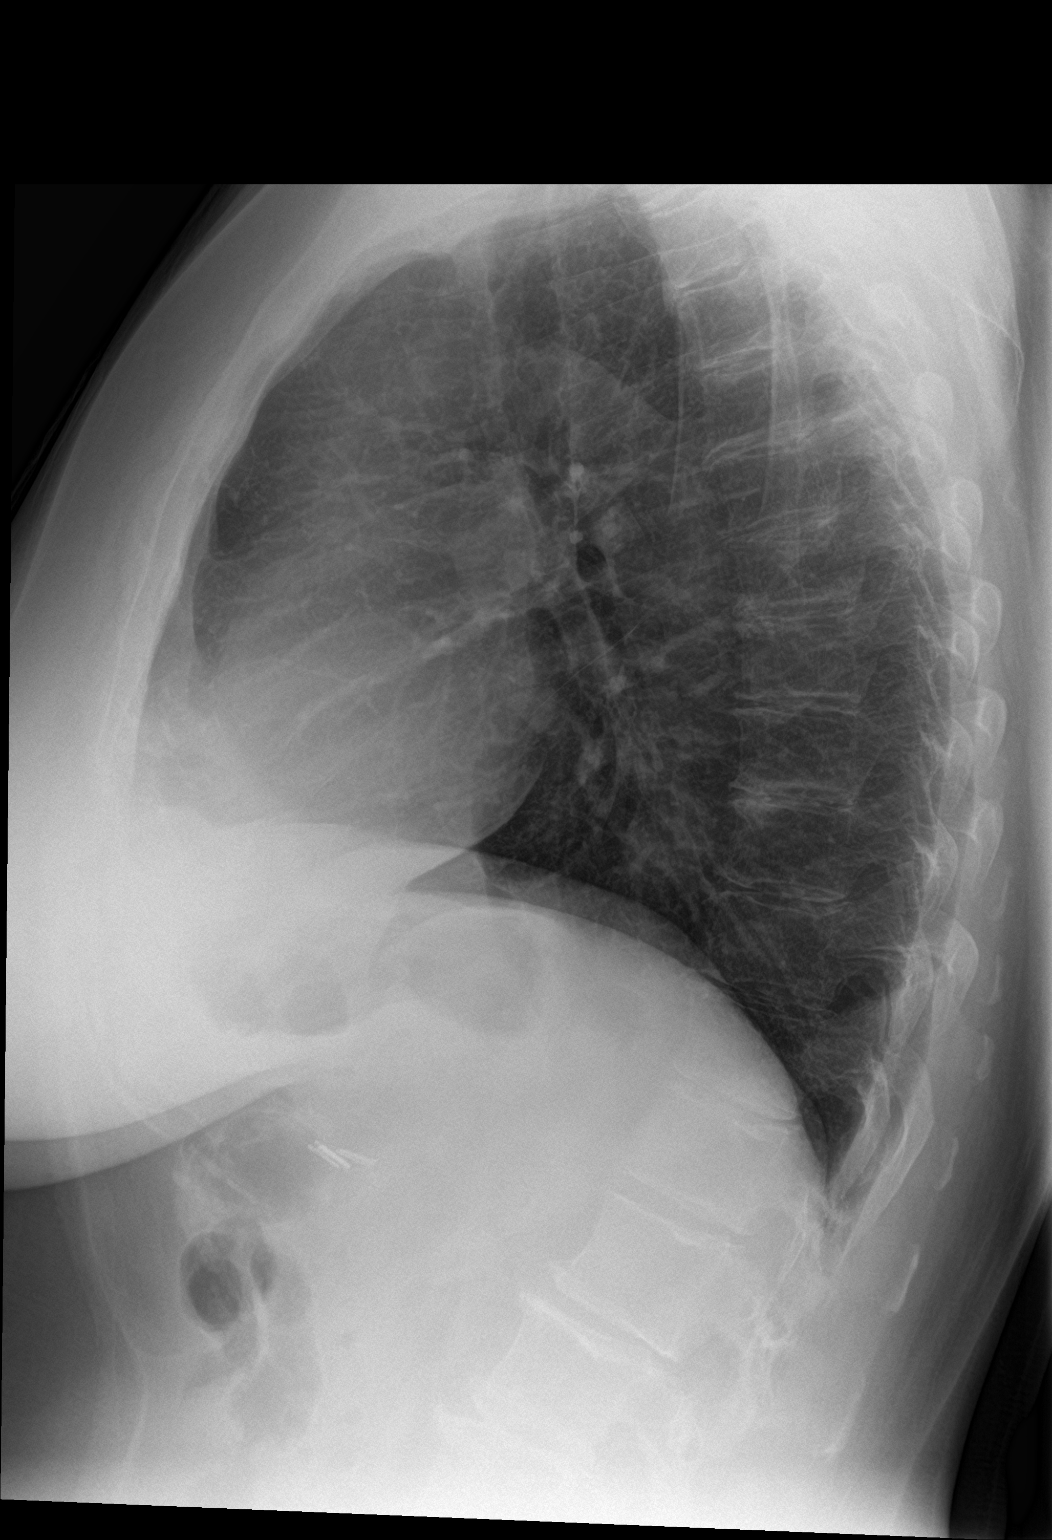

[2 of 2 positions shown; findings below may reference images not displayed]

FINDINGS: The lungs are well-expanded and clear. The heart and pulmonary
vascularity are normal. The mediastinum is normal in width. There is
no pleural effusion. The trachea is midline. The bony thorax
exhibits no acute abnormality.
IMPRESSION: There is no active cardiopulmonary disease.

## 2019-01-15 ENCOUNTER — Other Ambulatory Visit: Payer: Self-pay | Admitting: Nurse Practitioner

## 2019-01-15 ENCOUNTER — Other Ambulatory Visit: Payer: Self-pay | Admitting: Family Medicine

## 2019-01-15 DIAGNOSIS — Z1231 Encounter for screening mammogram for malignant neoplasm of breast: Secondary | ICD-10-CM

## 2019-02-23 ENCOUNTER — Ambulatory Visit
Admission: RE | Admit: 2019-02-23 | Discharge: 2019-02-23 | Disposition: A | Discharge status: Home / Self Care | Payer: BLUE CROSS/BLUE SHIELD | Source: Ambulatory Visit | Attending: Family Medicine | Admitting: Family Medicine

## 2019-02-23 DIAGNOSIS — Z1231 Encounter for screening mammogram for malignant neoplasm of breast: Secondary | ICD-10-CM | POA: Insufficient documentation

## 2020-02-29 ENCOUNTER — Other Ambulatory Visit: Payer: Self-pay | Admitting: Family Medicine

## 2020-02-29 DIAGNOSIS — Z1231 Encounter for screening mammogram for malignant neoplasm of breast: Secondary | ICD-10-CM

## 2020-03-08 ENCOUNTER — Ambulatory Visit
Admission: RE | Admit: 2020-03-08 | Discharge: 2020-03-08 | Disposition: A | Discharge status: Home / Self Care | Payer: BLUE CROSS/BLUE SHIELD | Source: Ambulatory Visit | Attending: Family Medicine | Admitting: Family Medicine

## 2020-03-08 ENCOUNTER — Other Ambulatory Visit: Payer: Self-pay

## 2020-03-08 DIAGNOSIS — Z1231 Encounter for screening mammogram for malignant neoplasm of breast: Secondary | ICD-10-CM | POA: Diagnosis present

## 2021-02-01 ENCOUNTER — Other Ambulatory Visit: Payer: Self-pay | Admitting: Family Medicine

## 2021-02-01 DIAGNOSIS — Z1231 Encounter for screening mammogram for malignant neoplasm of breast: Secondary | ICD-10-CM

## 2021-03-12 ENCOUNTER — Ambulatory Visit
Admission: RE | Admit: 2021-03-12 | Discharge: 2021-03-12 | Disposition: A | Discharge status: Home / Self Care | Payer: BLUE CROSS/BLUE SHIELD | Source: Ambulatory Visit | Attending: Family Medicine | Admitting: Family Medicine

## 2021-03-12 ENCOUNTER — Other Ambulatory Visit: Payer: Self-pay

## 2021-03-12 DIAGNOSIS — Z1231 Encounter for screening mammogram for malignant neoplasm of breast: Secondary | ICD-10-CM | POA: Insufficient documentation

## 2021-07-11 ENCOUNTER — Other Ambulatory Visit
Admission: RE | Admit: 2021-07-11 | Discharge: 2021-07-11 | Disposition: A | Discharge status: Home / Self Care | Payer: BLUE CROSS/BLUE SHIELD | Source: Ambulatory Visit | Attending: Internal Medicine | Admitting: Internal Medicine

## 2021-07-11 DIAGNOSIS — E7800 Pure hypercholesterolemia, unspecified: Secondary | ICD-10-CM | POA: Insufficient documentation

## 2021-07-11 DIAGNOSIS — Z79899 Other long term (current) drug therapy: Secondary | ICD-10-CM | POA: Insufficient documentation

## 2021-07-11 DIAGNOSIS — R0789 Other chest pain: Secondary | ICD-10-CM | POA: Insufficient documentation

## 2021-07-11 DIAGNOSIS — Z01810 Encounter for preprocedural cardiovascular examination: Secondary | ICD-10-CM | POA: Insufficient documentation

## 2021-07-11 DIAGNOSIS — R002 Palpitations: Secondary | ICD-10-CM | POA: Insufficient documentation

## 2021-07-11 LAB — BRAIN NATRIURETIC PEPTIDE: B Natriuretic Peptide: 16.9 pg/mL (ref 0.0–100.0)

## 2021-07-23 ENCOUNTER — Ambulatory Visit: Admission: RE | Admit: 2021-07-23 | Payer: 59 | Source: Home / Self Care | Admitting: Internal Medicine

## 2021-07-23 ENCOUNTER — Encounter: Admission: RE | Payer: Self-pay | Source: Home / Self Care

## 2021-07-23 DIAGNOSIS — I200 Unstable angina: Secondary | ICD-10-CM

## 2021-07-23 DIAGNOSIS — R9430 Abnormal result of cardiovascular function study, unspecified: Secondary | ICD-10-CM

## 2021-07-23 SURGERY — LEFT HEART CATH AND CORONARY ANGIOGRAPHY
Anesthesia: Moderate Sedation

## 2021-08-02 ENCOUNTER — Other Ambulatory Visit: Payer: Self-pay

## 2021-08-02 ENCOUNTER — Ambulatory Visit
Admission: RE | Admit: 2021-08-02 | Discharge: 2021-08-02 | Disposition: A | Discharge status: Home / Self Care | Payer: 59 | Attending: Internal Medicine | Admitting: Internal Medicine

## 2021-08-02 ENCOUNTER — Encounter
Admission: RE | Disposition: A | Discharge status: Home / Self Care | Payer: Self-pay | Source: Home / Self Care | Attending: Internal Medicine

## 2021-08-02 ENCOUNTER — Encounter: Payer: Self-pay | Admitting: Internal Medicine

## 2021-08-02 DIAGNOSIS — I25110 Atherosclerotic heart disease of native coronary artery with unstable angina pectoris: Secondary | ICD-10-CM | POA: Insufficient documentation

## 2021-08-02 DIAGNOSIS — I200 Unstable angina: Secondary | ICD-10-CM

## 2021-08-02 DIAGNOSIS — R9439 Abnormal result of other cardiovascular function study: Secondary | ICD-10-CM

## 2021-08-02 HISTORY — PX: LEFT HEART CATH AND CORONARY ANGIOGRAPHY: CATH118249

## 2021-08-02 SURGERY — LEFT HEART CATH AND CORONARY ANGIOGRAPHY
Anesthesia: Moderate Sedation

## 2021-08-02 MED ORDER — ACETAMINOPHEN 325 MG PO TABS: 650.0000 mg | ORAL_TABLET | ORAL | Status: DC | PRN

## 2021-08-02 MED ORDER — SODIUM CHLORIDE 0.9% FLUSH: 3.0000 mL | Freq: Two times a day (BID) | INTRAVENOUS | Status: DC

## 2021-08-02 MED ORDER — LIDOCAINE HCL (PF) 1 % IJ SOLN
INTRAMUSCULAR | Status: DC | PRN
  Administered 2021-08-02: 2 mL

## 2021-08-02 MED ORDER — SODIUM CHLORIDE 0.9 % IV SOLN: 250.0000 mL | INTRAVENOUS | Status: DC | PRN

## 2021-08-02 MED ORDER — IOHEXOL 300 MG/ML  SOLN
INTRAMUSCULAR | Status: DC | PRN
  Administered 2021-08-02: 89 mL

## 2021-08-02 MED ORDER — ONDANSETRON HCL 4 MG/2ML IJ SOLN: 4.0000 mg | Freq: Four times a day (QID) | INTRAMUSCULAR | Status: DC | PRN

## 2021-08-02 MED ORDER — DIPHENHYDRAMINE HCL 50 MG/ML IJ SOLN
INTRAMUSCULAR | Status: AC
  Filled 2021-08-02: qty 1

## 2021-08-02 MED ORDER — METHYLPREDNISOLONE SODIUM SUCC 125 MG IJ SOLR
INTRAMUSCULAR | Status: AC
  Filled 2021-08-02: qty 2

## 2021-08-02 MED ORDER — SODIUM CHLORIDE 0.9 % WEIGHT BASED INFUSION
1.0000 mL/kg/h | INTRAVENOUS | Status: DC
  Administered 2021-08-02: 1 mL/kg/h via INTRAVENOUS

## 2021-08-02 MED ORDER — SODIUM CHLORIDE 0.9% FLUSH: 3.0000 mL | INTRAVENOUS | Status: DC | PRN

## 2021-08-02 MED ORDER — VERAPAMIL HCL 2.5 MG/ML IV SOLN
INTRAVENOUS | Status: DC | PRN
  Administered 2021-08-02: 2.5 mg via INTRA_ARTERIAL

## 2021-08-02 MED ORDER — SODIUM CHLORIDE 0.9 % WEIGHT BASED INFUSION
3.0000 mL/kg/h | INTRAVENOUS | Status: AC
  Administered 2021-08-02: 3 mL/kg/h via INTRAVENOUS

## 2021-08-02 MED ORDER — HEPARIN (PORCINE) IN NACL 1000-0.9 UT/500ML-% IV SOLN
INTRAVENOUS | Status: DC | PRN
  Administered 2021-08-02 (×2): 500 mL

## 2021-08-02 MED ORDER — HEPARIN (PORCINE) IN NACL 1000-0.9 UT/500ML-% IV SOLN
INTRAVENOUS | Status: AC
  Filled 2021-08-02: qty 1000

## 2021-08-02 MED ORDER — ASPIRIN 81 MG PO CHEW
CHEWABLE_TABLET | ORAL | Status: DC
Start: 2021-08-02 — End: 2021-08-02
  Filled 2021-08-02: qty 1

## 2021-08-02 MED ORDER — MIDAZOLAM HCL 2 MG/2ML IJ SOLN
INTRAMUSCULAR | Status: AC
  Filled 2021-08-02: qty 2

## 2021-08-02 MED ORDER — FENTANYL CITRATE (PF) 100 MCG/2ML IJ SOLN
INTRAMUSCULAR | Status: AC
  Filled 2021-08-02: qty 2

## 2021-08-02 MED ORDER — HEPARIN SODIUM (PORCINE) 1000 UNIT/ML IJ SOLN
INTRAMUSCULAR | Status: AC
  Filled 2021-08-02: qty 10

## 2021-08-02 MED ORDER — ASPIRIN 81 MG PO CHEW
81.0000 mg | CHEWABLE_TABLET | ORAL | Status: AC
  Administered 2021-08-02: 81 mg via ORAL

## 2021-08-02 MED ORDER — HYDRALAZINE HCL 20 MG/ML IJ SOLN: 10.0000 mg | INTRAMUSCULAR | Status: DC | PRN

## 2021-08-02 MED ORDER — VERAPAMIL HCL 2.5 MG/ML IV SOLN
INTRAVENOUS | Status: AC
  Filled 2021-08-02: qty 2

## 2021-08-02 MED ORDER — LABETALOL HCL 5 MG/ML IV SOLN: 10.0000 mg | INTRAVENOUS | Status: DC | PRN

## 2021-08-02 MED ORDER — MIDAZOLAM HCL 2 MG/2ML IJ SOLN
INTRAMUSCULAR | Status: DC | PRN
  Administered 2021-08-02: 1 mg via INTRAVENOUS

## 2021-08-02 MED ORDER — LIDOCAINE HCL 1 % IJ SOLN
INTRAMUSCULAR | Status: AC
  Filled 2021-08-02: qty 20

## 2021-08-02 MED ORDER — DIPHENHYDRAMINE HCL 50 MG/ML IJ SOLN
INTRAMUSCULAR | Status: DC | PRN
  Administered 2021-08-02: 50 mg via INTRAVENOUS

## 2021-08-02 MED ORDER — FENTANYL CITRATE (PF) 100 MCG/2ML IJ SOLN
INTRAMUSCULAR | Status: DC | PRN
  Administered 2021-08-02: 25 ug via INTRAVENOUS

## 2021-08-02 MED ORDER — HEPARIN SODIUM (PORCINE) 1000 UNIT/ML IJ SOLN
INTRAMUSCULAR | Status: DC | PRN
  Administered 2021-08-02: 3000 [IU] via INTRAVENOUS

## 2021-08-02 SURGICAL SUPPLY — 10 items
CATH 5FR JL3.5 JR4 ANG PIG MP (CATHETERS) ×1 IMPLANT
DEVICE RAD TR BAND REGULAR (VASCULAR PRODUCTS) ×1 IMPLANT
DRAPE BRACHIAL (DRAPES) ×1 IMPLANT
GLIDESHEATH SLEND SS 6F .021 (SHEATH) ×1 IMPLANT
GUIDEWIRE INQWIRE 1.5J.035X260 (WIRE) IMPLANT
INQWIRE 1.5J .035X260CM (WIRE) ×2
PACK CARDIAC CATH (CUSTOM PROCEDURE TRAY) ×2 IMPLANT
PROTECTION STATION PRESSURIZED (MISCELLANEOUS) ×2
SET ATX SIMPLICITY (MISCELLANEOUS) ×1 IMPLANT
STATION PROTECTION PRESSURIZED (MISCELLANEOUS) IMPLANT

## 2021-08-03 LAB — CARDIAC CATHETERIZATION: Cath EF Quantitative: 60 %

## 2021-08-06 ENCOUNTER — Encounter: Payer: Self-pay | Admitting: Internal Medicine

## 2021-08-31 ENCOUNTER — Encounter
Admission: RE | Disposition: A | Discharge status: Home / Self Care | Payer: Self-pay | Source: Ambulatory Visit | Attending: Gastroenterology

## 2021-08-31 ENCOUNTER — Ambulatory Visit
Admission: RE | Admit: 2021-08-31 | Discharge: 2021-08-31 | Disposition: A | Discharge status: Home / Self Care | Payer: 59 | Source: Ambulatory Visit | Attending: Gastroenterology | Admitting: Gastroenterology

## 2021-08-31 ENCOUNTER — Ambulatory Visit: Payer: 59 | Admitting: Anesthesiology

## 2021-08-31 ENCOUNTER — Encounter: Payer: Self-pay | Admitting: Gastroenterology

## 2021-08-31 DIAGNOSIS — K219 Gastro-esophageal reflux disease without esophagitis: Secondary | ICD-10-CM | POA: Diagnosis present

## 2021-08-31 DIAGNOSIS — K3189 Other diseases of stomach and duodenum: Secondary | ICD-10-CM | POA: Diagnosis not present

## 2021-08-31 DIAGNOSIS — Z8371 Family history of colonic polyps: Secondary | ICD-10-CM | POA: Diagnosis not present

## 2021-08-31 DIAGNOSIS — Z8719 Personal history of other diseases of the digestive system: Secondary | ICD-10-CM | POA: Insufficient documentation

## 2021-08-31 DIAGNOSIS — K31A0 Gastric intestinal metaplasia, unspecified: Secondary | ICD-10-CM | POA: Insufficient documentation

## 2021-08-31 DIAGNOSIS — Z1211 Encounter for screening for malignant neoplasm of colon: Secondary | ICD-10-CM | POA: Diagnosis not present

## 2021-08-31 DIAGNOSIS — K640 First degree hemorrhoids: Secondary | ICD-10-CM | POA: Insufficient documentation

## 2021-08-31 DIAGNOSIS — K317 Polyp of stomach and duodenum: Secondary | ICD-10-CM | POA: Insufficient documentation

## 2021-08-31 HISTORY — PX: ESOPHAGOGASTRODUODENOSCOPY (EGD) WITH PROPOFOL: SHX5813

## 2021-08-31 HISTORY — PX: COLONOSCOPY WITH PROPOFOL: SHX5780

## 2021-08-31 SURGERY — COLONOSCOPY WITH PROPOFOL
Anesthesia: General

## 2021-08-31 MED ORDER — SIMETHICONE 40 MG/0.6ML PO SUSP
ORAL | Status: DC | PRN
  Administered 2021-08-31 (×2): 60 mL

## 2021-08-31 MED ORDER — SODIUM CHLORIDE 0.9 % IV SOLN
INTRAVENOUS | Status: DC
  Administered 2021-08-31: 20 mL/h via INTRAVENOUS

## 2021-08-31 MED ORDER — PHENYLEPHRINE HCL (PRESSORS) 10 MG/ML IV SOLN
INTRAVENOUS | Status: DC | PRN
  Administered 2021-08-31: 160 ug via INTRAVENOUS

## 2021-08-31 MED ORDER — SODIUM CHLORIDE 0.9 % IV SOLN: INTRAVENOUS | Status: DC | PRN

## 2021-08-31 MED ORDER — PROPOFOL 500 MG/50ML IV EMUL
INTRAVENOUS | Status: DC | PRN
Start: 2021-08-31 — End: 2021-08-31
  Administered 2021-08-31: 200 ug/kg/min via INTRAVENOUS

## 2021-08-31 MED ORDER — PROPOFOL 10 MG/ML IV BOLUS
INTRAVENOUS | Status: DC | PRN
  Administered 2021-08-31 (×2): 60 mg via INTRAVENOUS

## 2021-08-31 NOTE — H&P (Signed)
Outpatient short stay form Pre-procedure 08/31/2021  Lesly Rubenstein, MD  Primary Physician: Ranae Plumber, [place]  Reason for visit:  GERD/Surveillance colonoscopy  History of present illness:    66 y/o lady with history of GERD here for EGD and colonoscopy for history of polyps. Father with history of Lynch syndrome and a brother and sister with lynch syndrome. No blood thinners. No abdominal surgeries.    Current Facility-Administered Medications:    0.9 %  sodium chloride infusion, , Intravenous, Continuous, Zeya Balles, Hilton Cork, MD, Last Rate: 20 mL/hr at 08/31/21 1116, 20 mL/hr at 08/31/21 1116  Medications Prior to Admission  Medication Sig Dispense Refill Last Dose   acetaminophen (TYLENOL) 325 MG tablet Take 325-650 mg by mouth every 6 (six) hours as needed (HEADACHE).   Past Week   Cholecalciferol 1000 units tablet Take 1,000 Units by mouth in the morning.   Past Week   cyclobenzaprine (FLEXERIL) 10 MG tablet Take 10 mg by mouth 3 (three) times daily as needed for spasms.   Past Week   nitroGLYCERIN (NITROSTAT) 0.4 MG SL tablet Place 0.4 mg under the tongue every 5 (five) minutes x 3 doses as needed for chest pain.   Past Week   pantoprazole (PROTONIX) 40 MG tablet Take 40 mg by mouth 2 (two) times daily.   Past Week   PREMARIN vaginal cream Place 1 application. vaginally 2 (two) times a week. Wednesdays & Sundays.   Past Week     Allergies  Allergen Reactions   Penicillins Rash     Past Medical History:  Diagnosis Date   Displaced comminuted fracture of patella 10/04/2013   GERD (gastroesophageal reflux disease)    Screen for colon cancer 09/13/2013   Overview:  Patient has a family history of FAP.  She has been genetically tested and is negative.    Review of systems:  Otherwise negative.    Physical Exam  Gen: Alert, oriented. Appears stated age.  HEENT: PERRLA. Lungs: No respiratory distress CV: RRR Abd: soft, benign, no masses Ext: No edema    Planned  procedures: Proceed with EGD/colonoscopy. The patient understands the nature of the planned procedure, indications, risks, alternatives and potential complications including but not limited to bleeding, infection, perforation, damage to internal organs and possible oversedation/side effects from anesthesia. The patient agrees and gives consent to proceed.  Please refer to procedure notes for findings, recommendations and patient disposition/instructions.     Lesly Rubenstein, MD [facility] Gastroenterology

## 2021-08-31 NOTE — Op Note (Addendum)
[place] Gastroenterology Patient Name: Mary Graham Procedure Date: 08/31/2021 11:16 AM MRN: [id number] Account #: [id number] Date of Birth: 1956/03/18 Admit Type: Outpatient Age: 66 Room: [facility] ENDO ROOM 3 Gender: Female Note Status: Supervisor Override Instrument Name: Jasper Riling [id number] Procedure:             Colonoscopy Indications:           Personal history of colonic polyps Providers:             Andrey Farmer MD, MD Referring MD:          No Local Md, MD (Referring MD) Medicines:             Monitored Anesthesia Care Complications:         No immediate complications. Procedure:             Pre-Anesthesia Assessment:                        - Prior to the procedure, a History and Physical was                         performed, and patient medications and allergies were                         reviewed. The patient is competent. The risks and                         benefits of the procedure and the sedation options and                         risks were discussed with the patient. All questions                         were answered and informed consent was obtained.                         Patient identification and proposed procedure were                         verified by the physician, the nurse, the                         anesthesiologist, the anesthetist and the technician                         in the endoscopy suite. Mental Status Examination:                         alert and oriented. Airway Examination: normal                         oropharyngeal airway and neck mobility. Respiratory                         Examination: clear to auscultation. CV Examination:                         normal. Prophylactic Antibiotics: The patient does not  require prophylactic antibiotics. Prior                         Anticoagulants: The patient has taken no previous                         anticoagulant or antiplatelet agents. ASA  Grade                         Assessment: II - A patient with mild systemic disease.                         After reviewing the risks and benefits, the patient                         was deemed in satisfactory condition to undergo the                         procedure. The anesthesia plan was to use monitored                         anesthesia care (MAC). Immediately prior to                         administration of medications, the patient was                         re-assessed for adequacy to receive sedatives. The                         heart rate, respiratory rate, oxygen saturations,                         blood pressure, adequacy of pulmonary ventilation, and                         response to care were monitored throughout the                         procedure. The physical status of the patient was                         re-assessed after the procedure.                        After obtaining informed consent, the colonoscope was                         passed under direct vision. Throughout the procedure,                         the patient's blood pressure, pulse, and oxygen                         saturations were monitored continuously. The                         Colonoscope was introduced through the anus and  advanced to the the cecum, identified by appendiceal                         orifice and ileocecal valve. The colonoscopy was                         performed without difficulty. The patient tolerated                         the procedure well. The quality of the bowel                         preparation was good. Findings:      The perianal and digital rectal examinations were normal.      Internal hemorrhoids were found during retroflexion. The hemorrhoids       were Grade I (internal hemorrhoids that do not prolapse).      The exam was otherwise without abnormality on direct and retroflexion       views. Impression:            -  Internal hemorrhoids.                        - The examination was otherwise normal on direct and                         retroflexion views.                        - No specimens collected. Recommendation:        - Discharge patient to home.                        - Resume previous diet.                        - Continue present medications.                        - Repeat colonoscopy in 5 years for surveillance.                        - Return to referring physician as previously                         scheduled. Procedure Code(s):     --- Professional ---                        U8891, Colorectal cancer screening; colonoscopy on                         individual at high risk Diagnosis Code(s):     --- Professional ---                        Z83.71, Family history of colonic polyps                        K64.0, First degree hemorrhoids CPT copyright 2019 American Medical Association. All rights reserved. The codes documented in this report are preliminary and upon coder review may  be revised to meet current compliance requirements. Andrey Farmer MD, MD 08/31/2021 12:09:00 PM Number of Addenda: 0 Note Initiated On: 08/31/2021 11:16 AM Scope Withdrawal Time: 0 hours 5 minutes 59 seconds  Total Procedure Duration: 0 hours 12 minutes 25 seconds  Estimated Blood Loss:  Estimated blood loss: none.      [facility]

## 2021-08-31 NOTE — Op Note (Signed)
[facility] Patient Name: Mary Graham Procedure Date: 08/31/2021 11:17 AM MRN: [id number] Account #: [id number] Date of Birth: [birth year] Admit Type: Outpatient Age: 66 Room: [facility] ENDO ROOM 3 Gender: Female Note Status: Finalized Instrument Name: Upper Endoscope [id number] Procedure:             Upper GI endoscopy Indications:           Gastro-esophageal reflux disease Providers:             Andrey Farmer MD, MD Referring MD:          No Local Md, MD (Referring MD) Medicines:             Monitored Anesthesia Care Complications:         No immediate complications. Estimated blood loss:                         Minimal. Procedure:             Pre-Anesthesia Assessment:                        - Prior to the procedure, a History and Physical was                         performed, and patient medications and allergies were                         reviewed. The patient is competent. The risks and                         benefits of the procedure and the sedation options and                         risks were discussed with the patient. All questions                         were answered and informed consent was obtained.                         Patient identification and proposed procedure were                         verified by the physician, the nurse, the                         anesthesiologist, the anesthetist and the technician                         in the endoscopy suite. Mental Status Examination:                         alert and oriented. Airway Examination: normal                         oropharyngeal airway and neck mobility. Respiratory                         Examination: clear to auscultation. CV Examination:  normal. Prophylactic Antibiotics: The patient does not                         require prophylactic antibiotics. Prior                         Anticoagulants: The patient has taken no previous                          anticoagulant or antiplatelet agents. ASA Grade                         Assessment: II - A patient with mild systemic disease.                         After reviewing the risks and benefits, the patient                         was deemed in satisfactory condition to undergo the                         procedure. The anesthesia plan was to use monitored                         anesthesia care (MAC). Immediately prior to                         administration of medications, the patient was                         re-assessed for adequacy to receive sedatives. The                         heart rate, respiratory rate, oxygen saturations,                         blood pressure, adequacy of pulmonary ventilation, and                         response to care were monitored throughout the                         procedure. The physical status of the patient was                         re-assessed after the procedure.                        After obtaining informed consent, the endoscope was                         passed under direct vision. Throughout the procedure,                         the patient's blood pressure, pulse, and oxygen                         saturations were monitored continuously. The Endoscope  was introduced through the mouth, and advanced to the                         second part of duodenum. The upper GI endoscopy was                         accomplished without difficulty. The patient tolerated                         the procedure well. Findings:      The examined esophagus was normal.      Multiple small sessile fundic gland polyps with no bleeding and no       stigmata of recent bleeding were found in the gastric fundus and in the       gastric body. Biopsies were taken with a cold forceps for histology.       Estimated blood loss was minimal.      A single 7 mm papule (nodule) with no bleeding and no stigmata of recent        bleeding was found in the gastric antrum. Biopsies were taken with a       cold forceps for histology. Estimated blood loss was minimal.      Multiple small sessile polyps with no bleeding were found in the       duodenal bulb. Biopsies were taken with a cold forceps for histology.       Estimated blood loss was minimal. Impression:            - Normal esophagus.                        - Multiple fundic gland polyps. Biopsied.                        - A single papule (nodule) found in the stomach.                         Biopsied.                        - Multiple duodenal polyps. Biopsied. Recommendation:        - Await pathology results.                        - Perform a colonoscopy today. Procedure Code(s):     --- Professional ---                        [phone], Esophagogastroduodenoscopy, flexible,                         transoral; with biopsy, single or multiple Diagnosis Code(s):     --- Professional ---                        K31.7, Polyp of stomach and duodenum                        K31.89, Other diseases of stomach and duodenum                        K21.9, Gastro-esophageal reflux disease without  esophagitis CPT copyright 2019 American Medical Association. All rights reserved. The codes documented in this report are preliminary and upon coder review may  be revised to meet current compliance requirements. Andrey Farmer MD, MD 08/31/2021 12:06:53 PM Number of Addenda: 0 Note Initiated On: 08/31/2021 11:17 AM Estimated Blood Loss:  Estimated blood loss was minimal.      [facility]

## 2021-08-31 NOTE — Anesthesia Preprocedure Evaluation (Signed)
Anesthesia Evaluation  Patient identified by MRN, date of birth, ID band Patient awake    Reviewed: Allergy & Precautions, NPO status , Patient's Chart, lab work & pertinent test results  History of Anesthesia Complications Negative for: history of anesthetic complications  Airway Mallampati: III  TM Distance: <3 FB Neck ROM: full    Dental  (+) Chipped, Poor Dentition, Missing   Pulmonary neg pulmonary ROS, neg shortness of breath,    Pulmonary exam normal        Cardiovascular Exercise Tolerance: Good (-) anginanegative cardio ROS Normal cardiovascular exam     Neuro/Psych negative neurological ROS  negative psych ROS   GI/Hepatic Neg liver ROS, GERD  Controlled,  Endo/Other  negative endocrine ROS  Renal/GU negative Renal ROS  negative genitourinary   Musculoskeletal   Abdominal   Peds  Hematology negative hematology ROS (+)   Anesthesia Other Findings Past Medical History: 10/04/2013: Displaced comminuted fracture of patella No date: GERD (gastroesophageal reflux disease) 09/13/2013: Screen for colon cancer     Comment:  Overview:  Patient has a family history of FAP.  She has              been genetically tested and is negative.  Past Surgical History: No date: ABDOMINAL HYSTERECTOMY No date: BLADDER SURGERY No date: COLONOSCOPY WITH ESOPHAGOGASTRODUODENOSCOPY (EGD) 01/30/2017: COLONOSCOPY WITH PROPOFOL; N/A     Comment:  Procedure: COLONOSCOPY WITH PROPOFOL;  Surgeon:               Lollie Sails, MD;  Location: [facility] ENDOSCOPY;                Service: Endoscopy;  Laterality: N/A; 01/30/2017: ESOPHAGOGASTRODUODENOSCOPY (EGD) WITH PROPOFOL; N/A     Comment:  Procedure: ESOPHAGOGASTRODUODENOSCOPY (EGD) WITH               PROPOFOL;  Surgeon: Lollie Sails, MD;  Location:               [facility] ENDOSCOPY;  Service: Endoscopy;  Laterality: N/A; No date: FRACTURE SURGERY 2015: KNEE SURGERY 08/02/2021:  LEFT HEART CATH AND CORONARY ANGIOGRAPHY; N/A     Comment:  Procedure: LEFT HEART CATH AND CORONARY ANGIOGRAPHY;                Surgeon: Yolonda Kida, MD;  Location: [facility]              CV LAB;  Service: Cardiovascular;  Laterality: N/A; No date: TUBAL LIGATION  BMI    Body Mass Index: 29.29 kg/m      Reproductive/Obstetrics negative OB ROS                             Anesthesia Physical Anesthesia Plan  ASA: 2  Anesthesia Plan: General   Post-op Pain Management:    Induction: Intravenous  PONV Risk Score and Plan: Propofol infusion and TIVA  Airway Management Planned: Natural Airway and Nasal Cannula  Additional Equipment:   Intra-op Plan:   Post-operative Plan:   Informed Consent: I have reviewed the patients History and Physical, chart, labs and discussed the procedure including the risks, benefits and alternatives for the proposed anesthesia with the patient or authorized representative who has indicated his/her understanding and acceptance.     Dental Advisory Given  Plan Discussed with: Anesthesiologist, CRNA and Surgeon  Anesthesia Plan Comments: (Patient consented for risks of anesthesia including but not limited to:  - adverse reactions  to medications - risk of airway placement if required - damage to eyes, teeth, lips or other oral mucosa - nerve damage due to positioning  - sore throat or hoarseness - Damage to heart, brain, nerves, lungs, other parts of body or loss of life  Patient voiced understanding.)        Anesthesia Quick Evaluation

## 2021-08-31 NOTE — Anesthesia Procedure Notes (Signed)
Date/Time: 08/31/2021 11:46 AM Performed by: Nelda Marseille, CRNA Pre-anesthesia Checklist: Patient identified, Emergency Drugs available, Suction available, Patient being monitored and Timeout performed Oxygen Delivery Method: Nasal cannula

## 2021-08-31 NOTE — Transfer of Care (Signed)
Immediate Anesthesia Transfer of Care Note  Patient: Mary Graham  Procedure(s) Performed: COLONOSCOPY WITH PROPOFOL ESOPHAGOGASTRODUODENOSCOPY (EGD) WITH PROPOFOL  Patient Location: PACU  Anesthesia Type:General  Level of Consciousness: sedated  Airway & Oxygen Therapy: Patient Spontanous Breathing and Patient connected to nasal cannula oxygen  Post-op Assessment: Report given to RN and Post -op Vital signs reviewed and stable  Post vital signs: Reviewed and stable  Last Vitals:  Vitals Value Taken Time  BP    Temp 35.6 C 08/31/21 1206  Pulse 79 08/31/21 1208  Resp 13 08/31/21 1208  SpO2 99 % 08/31/21 1208  Vitals shown include unvalidated device data.  Last Pain:  Vitals:   08/31/21 1105  TempSrc: Temporal  PainSc: 0-No pain         Complications: No notable events documented.

## 2021-08-31 NOTE — Interval H&P Note (Signed)
History and Physical Interval Note:  08/31/2021 11:28 AM  Mary Graham  has presented today for surgery, with the diagnosis of HX OF ADENOMATOUS POLYPS, EPIGAASRIC PAIN, GERD.  The various methods of treatment have been discussed with the patient and family. After consideration of risks, benefits and other options for treatment, the patient has consented to  Procedure(s): COLONOSCOPY WITH PROPOFOL (N/A) ESOPHAGOGASTRODUODENOSCOPY (EGD) WITH PROPOFOL (N/A) as a surgical intervention.  The patient's history has been reviewed, patient examined, no change in status, stable for surgery.  I have reviewed the patient's chart and labs.  Questions were answered to the patient's satisfaction.     Mary Graham  Please note she has family history of FAP, not lynch.

## 2021-08-31 NOTE — Interval H&P Note (Signed)
History and Physical Interval Note:  08/31/2021 11:25 AM  Mary Graham  has presented today for surgery, with the diagnosis of HX OF ADENOMATOUS POLYPS, EPIGAASRIC PAIN, GERD.  The various methods of treatment have been discussed with the patient and family. After consideration of risks, benefits and other options for treatment, the patient has consented to  Procedure(s): COLONOSCOPY WITH PROPOFOL (N/A) ESOPHAGOGASTRODUODENOSCOPY (EGD) WITH PROPOFOL (N/A) as a surgical intervention.  The patient's history has been reviewed, patient examined, no change in status, stable for surgery.  I have reviewed the patient's chart and labs.  Questions were answered to the patient's satisfaction.     Lesly Rubenstein  Ok to proceed with EGD/Colonoscopy

## 2021-08-31 NOTE — Anesthesia Postprocedure Evaluation (Signed)
Anesthesia Post Note  Patient: Mary Graham  Procedure(s) Performed: COLONOSCOPY WITH PROPOFOL ESOPHAGOGASTRODUODENOSCOPY (EGD) WITH PROPOFOL  Patient location during evaluation: Endoscopy Anesthesia Type: General Level of consciousness: awake and alert Pain management: pain level controlled Vital Signs Assessment: post-procedure vital signs reviewed and stable Respiratory status: spontaneous breathing, nonlabored ventilation, respiratory function stable and patient connected to nasal cannula oxygen Cardiovascular status: blood pressure returned to baseline and stable Postop Assessment: no apparent nausea or vomiting Anesthetic complications: no   No notable events documented.   Last Vitals:  Vitals:   08/31/21 1216 08/31/21 1226  BP: (!) 98/43 (!) 98/48  Pulse: 81 76  Resp: 13 15  Temp:    SpO2: 100% 100%    Last Pain:  Vitals:   08/31/21 1236  TempSrc:   PainSc: (P) 0-No pain                 Precious Haws Arcelia Pals

## 2021-09-04 LAB — SURGICAL PATHOLOGY

## 2022-02-15 ENCOUNTER — Other Ambulatory Visit: Payer: Self-pay | Admitting: Family Medicine

## 2022-02-15 DIAGNOSIS — Z1231 Encounter for screening mammogram for malignant neoplasm of breast: Secondary | ICD-10-CM

## 2022-03-21 ENCOUNTER — Ambulatory Visit
Admission: RE | Admit: 2022-03-21 | Discharge: 2022-03-21 | Disposition: A | Discharge status: Home / Self Care | Payer: Medicare HMO | Source: Ambulatory Visit | Attending: Family Medicine | Admitting: Family Medicine

## 2022-03-21 DIAGNOSIS — Z1231 Encounter for screening mammogram for malignant neoplasm of breast: Secondary | ICD-10-CM | POA: Diagnosis present

## 2023-02-11 ENCOUNTER — Other Ambulatory Visit: Payer: Self-pay | Admitting: Family Medicine

## 2023-02-11 DIAGNOSIS — Z1231 Encounter for screening mammogram for malignant neoplasm of breast: Secondary | ICD-10-CM

## 2023-03-24 ENCOUNTER — Ambulatory Visit
Admission: RE | Admit: 2023-03-24 | Discharge: 2023-03-24 | Disposition: A | Discharge status: Home / Self Care | Payer: Medicare HMO | Source: Ambulatory Visit | Attending: Family Medicine | Admitting: Family Medicine

## 2023-03-24 DIAGNOSIS — Z1231 Encounter for screening mammogram for malignant neoplasm of breast: Secondary | ICD-10-CM | POA: Diagnosis present

## 2023-12-02 ENCOUNTER — Ambulatory Visit
Admission: RE | Admit: 2023-12-02 | Discharge: 2023-12-02 | Disposition: A | Discharge status: Home / Self Care | Attending: Gastroenterology | Admitting: Gastroenterology

## 2023-12-02 ENCOUNTER — Ambulatory Visit: Payer: Self-pay

## 2023-12-02 ENCOUNTER — Encounter
Admission: RE | Disposition: A | Discharge status: Home / Self Care | Payer: Self-pay | Source: Home / Self Care | Attending: Gastroenterology

## 2023-12-02 ENCOUNTER — Other Ambulatory Visit: Payer: Self-pay

## 2023-12-02 DIAGNOSIS — Z800 Family history of malignant neoplasm of digestive organs: Secondary | ICD-10-CM | POA: Diagnosis not present

## 2023-12-02 DIAGNOSIS — K219 Gastro-esophageal reflux disease without esophagitis: Secondary | ICD-10-CM | POA: Diagnosis present

## 2023-12-02 DIAGNOSIS — K449 Diaphragmatic hernia without obstruction or gangrene: Secondary | ICD-10-CM | POA: Insufficient documentation

## 2023-12-02 DIAGNOSIS — K2950 Unspecified chronic gastritis without bleeding: Secondary | ICD-10-CM | POA: Diagnosis not present

## 2023-12-02 DIAGNOSIS — K3189 Other diseases of stomach and duodenum: Secondary | ICD-10-CM | POA: Insufficient documentation

## 2023-12-02 DIAGNOSIS — K317 Polyp of stomach and duodenum: Secondary | ICD-10-CM | POA: Insufficient documentation

## 2023-12-02 DIAGNOSIS — Q399 Congenital malformation of esophagus, unspecified: Secondary | ICD-10-CM | POA: Diagnosis not present

## 2023-12-02 HISTORY — PX: ESOPHAGOGASTRODUODENOSCOPY: SHX5428

## 2023-12-02 SURGERY — EGD (ESOPHAGOGASTRODUODENOSCOPY)
Anesthesia: General

## 2023-12-02 MED ORDER — LIDOCAINE HCL (CARDIAC) PF 100 MG/5ML IV SOSY
PREFILLED_SYRINGE | INTRAVENOUS | Status: DC | PRN
  Administered 2023-12-02: 20 mg via INTRAVENOUS

## 2023-12-02 MED ORDER — PROPOFOL 10 MG/ML IV BOLUS
INTRAVENOUS | Status: DC | PRN
  Administered 2023-12-02: 60 mg via INTRAVENOUS
  Administered 2023-12-02 (×2): 30 mg via INTRAVENOUS
  Administered 2023-12-02: 20 mg via INTRAVENOUS
  Administered 2023-12-02: 30 mg via INTRAVENOUS
  Administered 2023-12-02: 20 mg via INTRAVENOUS

## 2023-12-02 MED ORDER — DEXMEDETOMIDINE HCL IN NACL 80 MCG/20ML IV SOLN
INTRAVENOUS | Status: DC | PRN
  Administered 2023-12-02: 4 ug via INTRAVENOUS

## 2023-12-02 MED ORDER — PROPOFOL 1000 MG/100ML IV EMUL
INTRAVENOUS | Status: AC
Start: 2023-12-02 — End: 2023-12-02
  Filled 2023-12-02: qty 100

## 2023-12-02 MED ORDER — SODIUM CHLORIDE 0.9 % IV SOLN: INTRAVENOUS | Status: DC

## 2023-12-02 NOTE — Transfer of Care (Signed)
 Immediate Anesthesia Transfer of Care Note  Patient: Mary Graham  Procedure(s) Performed: EGD (ESOPHAGOGASTRODUODENOSCOPY)  Patient Location: PACU  Anesthesia Type:MAC  Level of Consciousness: drowsy  Airway & Oxygen Therapy: Patient Spontanous Breathing and Patient connected to nasal cannula oxygen  Post-op Assessment: Report given to RN and Post -op Vital signs reviewed and stable  Post vital signs: Reviewed and stable  Last Vitals:  Vitals Value Taken Time  BP 117/65 0850  Temp    Pulse 95 12/02/23 08:50  Resp 13 12/02/23 08:50  SpO2 99 % 12/02/23 08:50  Vitals shown include unfiled device data.  Last Pain:  Vitals:   12/02/23 0733  TempSrc: Temporal  PainSc: 0-No pain         Complications: No notable events documented.

## 2023-12-02 NOTE — Anesthesia Preprocedure Evaluation (Signed)
 Anesthesia Evaluation  Patient identified by MRN, date of birth, ID band Patient awake    Reviewed: Allergy & Precautions, NPO status , Patient's Chart, lab work & pertinent test results  History of Anesthesia Complications Negative for: history of anesthetic complications  Airway Mallampati: III  TM Distance: <3 FB Neck ROM: full    Dental  (+) Chipped, Poor Dentition, Missing   Pulmonary neg pulmonary ROS, neg shortness of breath   Pulmonary exam normal        Cardiovascular Exercise Tolerance: Good (-) angina negative cardio ROS Normal cardiovascular exam     Neuro/Psych negative neurological ROS  negative psych ROS   GI/Hepatic Neg liver ROS,GERD  Controlled,,  Endo/Other  negative endocrine ROS    Renal/GU      Musculoskeletal   Abdominal   Peds  Hematology negative hematology ROS (+)   Anesthesia Other Findings Past Medical History: 10/04/2013: Displaced comminuted fracture of patella No date: GERD (gastroesophageal reflux disease) 09/13/2013: Screen for colon cancer     Comment:  Overview:  Patient has a family history of FAP.  She has              been genetically tested and is negative.  Past Surgical History: No date: ABDOMINAL HYSTERECTOMY No date: BLADDER SURGERY No date: COLONOSCOPY WITH ESOPHAGOGASTRODUODENOSCOPY (EGD) 01/30/2017: COLONOSCOPY WITH PROPOFOL ; N/A     Comment:  Procedure: COLONOSCOPY WITH PROPOFOL ;  Surgeon:               Gaylyn Gladis PENNER, MD;  Location: [facility] ENDOSCOPY;                Service: Endoscopy;  Laterality: N/A; 01/30/2017: ESOPHAGOGASTRODUODENOSCOPY (EGD) WITH PROPOFOL ; N/A     Comment:  Procedure: ESOPHAGOGASTRODUODENOSCOPY (EGD) WITH               PROPOFOL ;  Surgeon: Gaylyn Gladis PENNER, MD;  Location:               [facility] ENDOSCOPY;  Service: Endoscopy;  Laterality: N/A; No date: FRACTURE SURGERY 2015: KNEE SURGERY 08/02/2021: LEFT HEART CATH AND CORONARY  ANGIOGRAPHY; N/A     Comment:  Procedure: LEFT HEART CATH AND CORONARY ANGIOGRAPHY;                Surgeon: Florencio Cara BIRCH, MD;  Location: [facility] INVASIVE              CV LAB;  Service: Cardiovascular;  Laterality: N/A; No date: TUBAL LIGATION  BMI    Body Mass Index: 29.29 kg/m      Reproductive/Obstetrics negative OB ROS                              Anesthesia Physical Anesthesia Plan  ASA: 2  Anesthesia Plan: General   Post-op Pain Management: Minimal or no pain anticipated   Induction: Intravenous  PONV Risk Score and Plan: 2 and Propofol  infusion and TIVA  Airway Management Planned: Nasal Cannula  Additional Equipment: None  Intra-op Plan:   Post-operative Plan:   Informed Consent: I have reviewed the patients History and Physical, chart, labs and discussed the procedure including the risks, benefits and alternatives for the proposed anesthesia with the patient or authorized representative who has indicated his/her understanding and acceptance.     Dental advisory given  Plan Discussed with: CRNA and Surgeon  Anesthesia Plan Comments: (Discussed risks of anesthesia with patient, including possibility of difficulty with spontaneous ventilation under  anesthesia necessitating airway intervention, PONV, and rare risks such as cardiac or respiratory or neurological events, and allergic reactions. Discussed the role of CRNA in patient's perioperative care. Patient understands.)        Anesthesia Quick Evaluation

## 2023-12-02 NOTE — Op Note (Signed)
 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 Estimated blood loss:                         Minimal. Procedure:             Pre-Anesthesia Assessment:                        - Prior to the procedure, a History and Physical was                         performed, and patient medications and allergies were                         reviewed. The patient is competent. The risks and                         benefits of the procedure and the sedation options and                         risks were discussed with the patient. All questions                         were answered and informed consent was obtained.                         Patient identification and proposed procedure were                         verified by the physician, the nurse, the                         anesthesiologist, the anesthetist and the technician                         in the endoscopy suite. Mental Status Examination:                         alert and oriented. Airway Examination: normal                         oropharyngeal airway and neck mobility. Respiratory  Examination: clear to auscultation. CV Examination:                         normal. Prophylactic Antibiotics: The patient does not                         require prophylactic antibiotics. Prior                          Anticoagulants: The patient has taken no anticoagulant                         or antiplatelet agents. ASA Grade Assessment: II - A                         patient with mild systemic disease. After reviewing                         the risks and benefits, the patient was deemed in                         satisfactory condition to undergo the procedure. The                         anesthesia plan was to use monitored anesthesia care                         (MAC). Immediately prior to administration of                         medications, the patient was re-assessed for adequacy                         to receive sedatives. The heart rate, respiratory                         rate, oxygen saturations, blood pressure, adequacy of                         pulmonary ventilation, and response to care were                         monitored throughout the procedure. The physical                         status of the patient was re-assessed after the                         procedure.                        After obtaining informed consent, the endoscope was                         passed under direct vision. Throughout the procedure,                         the patient's blood pressure, pulse, and oxygen  saturations were monitored continuously. The Endoscope                         was introduced through the mouth, and advanced to the                         second part of duodenum. The upper GI endoscopy was                         accomplished without difficulty. The patient tolerated                         the procedure well. Findings:      The distal esophagus was mildly tortuous.      A small hiatal hernia was present.      Multiple 2 to 15 mm semi-pedunculated fundic gland polyps with no       bleeding and no stigmata of recent bleeding were found in the gastric       fundus and in the gastric body. Biopsies were taken with a cold forceps       for histology.  Estimated blood loss was minimal.      Biopsies were taken with a cold forceps in the gastric body and in the       gastric antrum for histology. Estimated blood loss was minimal.      The examined duodenum was normal. Impression:            - Tortuous esophagus.                        - Small hiatal hernia.                        - Multiple fundic gland polyps. Biopsied.                        - Normal examined duodenum.                        - Biopsies were taken with a cold forceps for                         histology in the gastric body and in the gastric                         antrum. Recommendation:        - Discharge patient to home.                        - Resume previous diet.                        - Continue present medications.                        - Await pathology results.                        - Return to referring physician as previously                         scheduled.  Procedure Code(s):     --- Professional ---                        [phone], Esophagogastroduodenoscopy, flexible,                         transoral; with biopsy, single or multiple Diagnosis Code(s):     --- Professional ---                        Q39.9, Congenital malformation of esophagus,                         unspecified                        K44.9, Diaphragmatic hernia without obstruction or                         gangrene                        K31.7, Polyp of stomach and duodenum                        K21.9, Gastro-esophageal reflux disease without                         esophagitis                        K31.A19, Gastric intestinal metaplasia without                         dysplasia, unspecified site CPT copyright 2022 American Medical Association. All rights reserved. The codes documented in this report are preliminary and upon coder review may  be revised to meet current compliance requirements. Ole Schick MD, MD 12/02/2023 8:49:18 AM Number of Addenda: 0 Note Initiated  On: 12/02/2023 8:19 AM Estimated Blood Loss:  Estimated blood loss was minimal.      [facility]

## 2023-12-02 NOTE — Anesthesia Postprocedure Evaluation (Signed)
 Anesthesia Post Note  Patient: Mary Graham  Procedure(s) Performed: EGD (ESOPHAGOGASTRODUODENOSCOPY)  Patient location during evaluation: Endoscopy Anesthesia Type: General Level of consciousness: awake and alert Pain management: pain level controlled Vital Signs Assessment: post-procedure vital signs reviewed and stable Respiratory status: spontaneous breathing, nonlabored ventilation, respiratory function stable and patient connected to nasal cannula oxygen Cardiovascular status: blood pressure returned to baseline and stable Postop Assessment: no apparent nausea or vomiting Anesthetic complications: no   No notable events documented.   Last Vitals:  Vitals:   12/02/23 0733 12/02/23 0850  BP: (!) 154/81 117/74  Pulse: (!) 128   Temp: (!) 36.2 C (!) 35.7 C  SpO2: 100%     Last Pain:  Vitals:   12/02/23 0910  TempSrc:   PainSc: 0-No pain                 Debby Mines

## 2023-12-02 NOTE — H&P (Signed)
 Outpatient short stay form Pre-procedure 12/02/2023  Mary Graham Schick, MD  Primary Physician: Donnie Handing, PA  Reason for visit:  GIM  History of present illness:    68 y/o lady with history of GERD here for EGD for history of GIM. No blood thinners. Family history of colon cancer in father and siblings. She has tested negative for any hereditary colon cancer syndromes. History of hysterectomy and cholecystectomy.    Current Facility-Administered Medications:    0.9 %  sodium chloride  infusion, , Intravenous, Continuous, Koven Belinsky, Mary ONEIDA, MD, Last Rate: 20 mL/hr at 12/02/23 0736, New Bag at 12/02/23 0736  Medications Prior to Admission  Medication Sig Dispense Refill Last Dose/Taking   acetaminophen  (TYLENOL ) 325 MG tablet Take 325-650 mg by mouth every 6 (six) hours as needed (HEADACHE).   12/01/2023   Cholecalciferol 1000 units tablet Take 1,000 Units by mouth in the morning.   Past Week   cyclobenzaprine (FLEXERIL) 10 MG tablet Take 10 mg by mouth 3 (three) times daily as needed for spasms.   Past Week   pantoprazole (PROTONIX) 40 MG tablet Take 40 mg by mouth 2 (two) times daily.   12/01/2023   nitroGLYCERIN (NITROSTAT) 0.4 MG SL tablet Place 0.4 mg under the tongue every 5 (five) minutes x 3 doses as needed for chest pain.      PREMARIN vaginal cream Place 1 application. vaginally 2 (two) times a week. Wednesdays & Sundays.        Allergies  Allergen Reactions   Penicillins Rash     Past Medical History:  Diagnosis Date   Displaced comminuted fracture of patella 10/04/2013   GERD (gastroesophageal reflux disease)    Screen for colon cancer 09/13/2013   Overview:  Patient has a family history of FAP.  She has been genetically tested and is negative.    Review of systems:  Otherwise negative.    Physical Exam  Gen: Alert, oriented. Appears stated age.  HEENT: PERRLA. Lungs: No respiratory distress CV: RRR Abd: soft, benign, no masses Ext: No  edema    Planned procedures: Proceed with EGD. The patient understands the nature of the planned procedure, indications, risks, alternatives and potential complications including but not limited to bleeding, infection, perforation, damage to internal organs and possible oversedation/side effects from anesthesia. The patient agrees and gives consent to proceed.  Please refer to procedure notes for findings, recommendations and patient disposition/instructions.     Mary Graham Schick, MD [facility]

## 2023-12-02 NOTE — Interval H&P Note (Signed)
 History and Physical Interval Note:  12/02/2023 8:29 AM  Mary Graham  has presented today for surgery, with the diagnosis of GERD.  The various methods of treatment have been discussed with the patient and family. After consideration of risks, benefits and other options for treatment, the patient has consented to  Procedure(s): EGD (ESOPHAGOGASTRODUODENOSCOPY) (N/A) as a surgical intervention.  The patient's history has been reviewed, patient examined, no change in status, stable for surgery.  I have reviewed the patient's chart and labs.  Questions were answered to the patient's satisfaction.     Ole ONEIDA Schick  Ok to proceed with EGD

## 2023-12-03 LAB — SURGICAL PATHOLOGY

## 2024-02-13 ENCOUNTER — Other Ambulatory Visit: Payer: Self-pay | Admitting: Family Medicine

## 2024-02-13 DIAGNOSIS — Z1231 Encounter for screening mammogram for malignant neoplasm of breast: Secondary | ICD-10-CM

## 2024-03-24 ENCOUNTER — Ambulatory Visit
Admission: RE | Admit: 2024-03-24 | Discharge: 2024-03-24 | Disposition: A | Discharge status: Home / Self Care | Source: Ambulatory Visit | Attending: Family Medicine | Admitting: Family Medicine

## 2024-03-24 DIAGNOSIS — Z1231 Encounter for screening mammogram for malignant neoplasm of breast: Secondary | ICD-10-CM | POA: Diagnosis present
